# Patient Record
Sex: Male | Born: 1935 | Race: White | Hispanic: No | Smoking: Former smoker
Health system: Southern US, Community
[De-identification: ages and names within clinical notes are randomized; demographics above are authoritative.]

## PROBLEM LIST (undated history)

## (undated) DIAGNOSIS — I351 Nonrheumatic aortic (valve) insufficiency: Secondary | ICD-10-CM

## (undated) DIAGNOSIS — I714 Abdominal aortic aneurysm, without rupture, unspecified: Secondary | ICD-10-CM

## (undated) DIAGNOSIS — I82409 Acute embolism and thrombosis of unspecified deep veins of unspecified lower extremity: Secondary | ICD-10-CM

## (undated) DIAGNOSIS — M1990 Unspecified osteoarthritis, unspecified site: Secondary | ICD-10-CM

## (undated) DIAGNOSIS — I2510 Atherosclerotic heart disease of native coronary artery without angina pectoris: Secondary | ICD-10-CM

## (undated) DIAGNOSIS — I509 Heart failure, unspecified: Secondary | ICD-10-CM

## (undated) DIAGNOSIS — E785 Hyperlipidemia, unspecified: Secondary | ICD-10-CM

## (undated) DIAGNOSIS — Z8719 Personal history of other diseases of the digestive system: Secondary | ICD-10-CM

## (undated) DIAGNOSIS — I493 Ventricular premature depolarization: Secondary | ICD-10-CM

## (undated) DIAGNOSIS — I4891 Unspecified atrial fibrillation: Secondary | ICD-10-CM

## (undated) DIAGNOSIS — K254 Chronic or unspecified gastric ulcer with hemorrhage: Secondary | ICD-10-CM

## (undated) DIAGNOSIS — K219 Gastro-esophageal reflux disease without esophagitis: Secondary | ICD-10-CM

## (undated) DIAGNOSIS — K449 Diaphragmatic hernia without obstruction or gangrene: Secondary | ICD-10-CM

## (undated) DIAGNOSIS — I10 Essential (primary) hypertension: Secondary | ICD-10-CM

## (undated) DIAGNOSIS — K222 Esophageal obstruction: Secondary | ICD-10-CM

## (undated) HISTORY — DX: Heart failure, unspecified: I50.9

## (undated) HISTORY — DX: Abdominal aortic aneurysm, without rupture: I71.4

## (undated) HISTORY — DX: Nonrheumatic aortic (valve) insufficiency: I35.1

## (undated) HISTORY — DX: Unspecified atrial fibrillation: I48.91

## (undated) HISTORY — DX: Diaphragmatic hernia without obstruction or gangrene: K44.9

## (undated) HISTORY — PX: NASAL SINUS SURGERY: SHX719

## (undated) HISTORY — DX: Ventricular premature depolarization: I49.3

## (undated) HISTORY — DX: Unspecified osteoarthritis, unspecified site: M19.90

## (undated) HISTORY — DX: Personal history of other diseases of the digestive system: Z87.19

## (undated) HISTORY — DX: Esophageal obstruction: K22.2

## (undated) HISTORY — DX: Abdominal aortic aneurysm, without rupture, unspecified: I71.40

## (undated) HISTORY — DX: Chronic or unspecified gastric ulcer with hemorrhage: K25.4

## (undated) HISTORY — DX: Acute embolism and thrombosis of unspecified deep veins of unspecified lower extremity: I82.409

## (undated) HISTORY — PX: CHOLECYSTECTOMY: SHX55

## (undated) HISTORY — PX: APPENDECTOMY: SHX54

---

## 2003-05-02 HISTORY — PX: CORONARY ANGIOPLASTY: SHX604

## 2003-05-18 ENCOUNTER — Inpatient Hospital Stay (HOSPITAL_COMMUNITY): Admission: AD | Admit: 2003-05-18 | Discharge: 2003-05-24 | Payer: Self-pay | Admitting: Internal Medicine

## 2003-05-22 ENCOUNTER — Encounter: Payer: Self-pay | Admitting: *Deleted

## 2006-02-10 ENCOUNTER — Ambulatory Visit (HOSPITAL_COMMUNITY): Admission: RE | Admit: 2006-02-10 | Discharge: 2006-02-10 | Payer: Self-pay | Admitting: *Deleted

## 2006-02-10 HISTORY — PX: CARDIAC CATHETERIZATION: SHX172

## 2012-09-24 ENCOUNTER — Encounter (HOSPITAL_COMMUNITY)
Admission: EM | Disposition: A | Discharge status: Home / Self Care | Payer: Self-pay | Source: Other Acute Inpatient Hospital | Attending: Cardiovascular Disease

## 2012-09-24 ENCOUNTER — Inpatient Hospital Stay (HOSPITAL_COMMUNITY): Payer: Medicare Other

## 2012-09-24 ENCOUNTER — Observation Stay (HOSPITAL_COMMUNITY): Payer: Medicare Other

## 2012-09-24 ENCOUNTER — Ambulatory Visit (HOSPITAL_COMMUNITY): Admit: 2012-09-24 | Payer: Self-pay | Admitting: Cardiovascular Disease

## 2012-09-24 ENCOUNTER — Inpatient Hospital Stay (HOSPITAL_COMMUNITY)
Admission: EM | Admit: 2012-09-24 | Discharge: 2012-09-27 | DRG: 392 | Disposition: A | Discharge status: Home / Self Care | Payer: Medicare Other | Source: Other Acute Inpatient Hospital | Attending: Cardiovascular Disease | Admitting: Cardiovascular Disease

## 2012-09-24 ENCOUNTER — Encounter (HOSPITAL_COMMUNITY): Payer: Self-pay | Admitting: Internal Medicine

## 2012-09-24 DIAGNOSIS — T18108A Unspecified foreign body in esophagus causing other injury, initial encounter: Secondary | ICD-10-CM | POA: Diagnosis present

## 2012-09-24 DIAGNOSIS — K219 Gastro-esophageal reflux disease without esophagitis: Secondary | ICD-10-CM | POA: Diagnosis present

## 2012-09-24 DIAGNOSIS — K2980 Duodenitis without bleeding: Secondary | ICD-10-CM | POA: Diagnosis present

## 2012-09-24 DIAGNOSIS — K5900 Constipation, unspecified: Secondary | ICD-10-CM | POA: Diagnosis not present

## 2012-09-24 DIAGNOSIS — I10 Essential (primary) hypertension: Secondary | ICD-10-CM

## 2012-09-24 DIAGNOSIS — IMO0002 Reserved for concepts with insufficient information to code with codable children: Secondary | ICD-10-CM | POA: Diagnosis present

## 2012-09-24 DIAGNOSIS — K229 Disease of esophagus, unspecified: Secondary | ICD-10-CM | POA: Diagnosis present

## 2012-09-24 DIAGNOSIS — D696 Thrombocytopenia, unspecified: Secondary | ICD-10-CM | POA: Diagnosis present

## 2012-09-24 DIAGNOSIS — Z79899 Other long term (current) drug therapy: Secondary | ICD-10-CM

## 2012-09-24 DIAGNOSIS — R1310 Dysphagia, unspecified: Secondary | ICD-10-CM

## 2012-09-24 DIAGNOSIS — R079 Chest pain, unspecified: Secondary | ICD-10-CM | POA: Diagnosis present

## 2012-09-24 DIAGNOSIS — Z7982 Long term (current) use of aspirin: Secondary | ICD-10-CM

## 2012-09-24 DIAGNOSIS — E785 Hyperlipidemia, unspecified: Secondary | ICD-10-CM

## 2012-09-24 DIAGNOSIS — I2510 Atherosclerotic heart disease of native coronary artery without angina pectoris: Secondary | ICD-10-CM | POA: Diagnosis present

## 2012-09-24 DIAGNOSIS — K208 Other esophagitis without bleeding: Principal | ICD-10-CM | POA: Diagnosis present

## 2012-09-24 DIAGNOSIS — K228 Other specified diseases of esophagus: Secondary | ICD-10-CM

## 2012-09-24 DIAGNOSIS — Z9861 Coronary angioplasty status: Secondary | ICD-10-CM

## 2012-09-24 HISTORY — PX: LEFT HEART CATH: SHX5478

## 2012-09-24 HISTORY — DX: Essential (primary) hypertension: I10

## 2012-09-24 HISTORY — DX: Atherosclerotic heart disease of native coronary artery without angina pectoris: I25.10

## 2012-09-24 HISTORY — PX: CARDIAC CATHETERIZATION: SHX172

## 2012-09-24 HISTORY — DX: Hyperlipidemia, unspecified: E78.5

## 2012-09-24 HISTORY — DX: Gastro-esophageal reflux disease without esophagitis: K21.9

## 2012-09-24 LAB — BASIC METABOLIC PANEL
BUN: 19 mg/dL (ref 6–23)
CO2: 25 mEq/L (ref 19–32)
Chloride: 105 mEq/L (ref 96–112)
Creatinine, Ser: 1.05 mg/dL (ref 0.50–1.35)
Glucose, Bld: 163 mg/dL — ABNORMAL HIGH (ref 70–99)

## 2012-09-24 LAB — CBC
HCT: 39.3 % (ref 39.0–52.0)
Hemoglobin: 13.7 g/dL (ref 13.0–17.0)
MCH: 32.7 pg (ref 26.0–34.0)
MCV: 93.8 fL (ref 78.0–100.0)
Platelets: 131 10*3/uL — ABNORMAL LOW (ref 150–400)
RBC: 4.19 MIL/uL — ABNORMAL LOW (ref 4.22–5.81)
WBC: 10.5 10*3/uL (ref 4.0–10.5)

## 2012-09-24 LAB — TROPONIN I: Troponin I: <0.3 ng/mL (ref <0.30)

## 2012-09-24 LAB — CK TOTAL AND CKMB (NOT AT ARMC): Total CK: 130 U/L (ref 7–232)

## 2012-09-24 LAB — PROTIME-INR
INR: 1.15 (ref 0.00–1.49)
Prothrombin Time: 14.5 seconds (ref 11.6–15.2)

## 2012-09-24 LAB — MRSA PCR SCREENING: MRSA by PCR: NEGATIVE

## 2012-09-24 SURGERY — LEFT HEART CATH
Anesthesia: LOCAL

## 2012-09-24 MED ORDER — LIDOCAINE HCL (PF) 1 % IJ SOLN
INTRAMUSCULAR | Status: AC
  Filled 2012-09-24: qty 30

## 2012-09-24 MED ORDER — SODIUM CHLORIDE 0.9 % IV SOLN: 250.0000 mL | INTRAVENOUS | Status: DC

## 2012-09-24 MED ORDER — ATORVASTATIN CALCIUM 20 MG PO TABS
20.0000 mg | ORAL_TABLET | Freq: Every day | ORAL | Status: DC
  Administered 2012-09-24 – 2012-09-25 (×2): 20 mg via ORAL
  Filled 2012-09-24 (×4): qty 1

## 2012-09-24 MED ORDER — HEPARIN (PORCINE) IN NACL 2-0.9 UNIT/ML-% IJ SOLN
INTRAMUSCULAR | Status: AC
  Filled 2012-09-24: qty 1500

## 2012-09-24 MED ORDER — IOHEXOL 350 MG/ML SOLN
80.0000 mL | Freq: Once | INTRAVENOUS | Status: AC | PRN
  Administered 2012-09-24: 80 mL via INTRAVENOUS

## 2012-09-24 MED ORDER — NITROGLYCERIN 0.2 MG/ML ON CALL CATH LAB
INTRAVENOUS | Status: AC
  Filled 2012-09-24: qty 1

## 2012-09-24 MED ORDER — METOPROLOL TARTRATE 12.5 MG HALF TABLET
12.5000 mg | ORAL_TABLET | Freq: Two times a day (BID) | ORAL | Status: DC
  Administered 2012-09-24 – 2012-09-26 (×4): 12.5 mg via ORAL
  Filled 2012-09-24 (×6): qty 1

## 2012-09-24 MED ORDER — PANTOPRAZOLE SODIUM 40 MG PO TBEC: 40.0000 mg | DELAYED_RELEASE_TABLET | Freq: Every day | ORAL | Status: DC

## 2012-09-24 MED ORDER — SODIUM CHLORIDE 0.9 % IJ SOLN: 3.0000 mL | Freq: Two times a day (BID) | INTRAMUSCULAR | Status: DC

## 2012-09-24 MED ORDER — MORPHINE SULFATE 2 MG/ML IJ SOLN: 1.0000 mg | INTRAMUSCULAR | Status: DC | PRN

## 2012-09-24 MED ORDER — NITROGLYCERIN IN D5W 200-5 MCG/ML-% IV SOLN: 2.0000 ug/min | INTRAVENOUS | Status: DC

## 2012-09-24 MED ORDER — SODIUM CHLORIDE 0.9 % IV SOLN: INTRAVENOUS | Status: AC

## 2012-09-24 MED ORDER — HEPARIN (PORCINE) IN NACL 100-0.45 UNIT/ML-% IJ SOLN
750.0000 [IU]/h | INTRAMUSCULAR | Status: DC
  Filled 2012-09-24: qty 250

## 2012-09-24 MED ORDER — SODIUM CHLORIDE 0.9 % IV SOLN
INTRAVENOUS | Status: DC
  Administered 2012-09-24 – 2012-09-25 (×2): via INTRAVENOUS

## 2012-09-24 MED ORDER — ONDANSETRON HCL 4 MG/2ML IJ SOLN: 4.0000 mg | Freq: Four times a day (QID) | INTRAMUSCULAR | Status: DC | PRN

## 2012-09-24 MED ORDER — ALUM & MAG HYDROXIDE-SIMETH 200-200-20 MG/5ML PO SUSP
30.0000 mL | Freq: Four times a day (QID) | ORAL | Status: DC | PRN
  Administered 2012-09-24: 30 mL via ORAL
  Filled 2012-09-24: qty 30

## 2012-09-24 MED ORDER — SODIUM CHLORIDE 0.9 % IJ SOLN: 3.0000 mL | INTRAMUSCULAR | Status: DC | PRN

## 2012-09-24 MED ORDER — MORPHINE SULFATE 2 MG/ML IJ SOLN
1.0000 mg | INTRAMUSCULAR | Status: DC | PRN
  Administered 2012-09-25: 1 mg via INTRAVENOUS
  Filled 2012-09-24: qty 1

## 2012-09-24 MED ORDER — HEPARIN BOLUS VIA INFUSION
3000.0000 [IU] | Freq: Once | INTRAVENOUS | Status: DC
  Filled 2012-09-24: qty 3000

## 2012-09-24 MED ORDER — ONDANSETRON HCL 4 MG PO TABS: 4.0000 mg | ORAL_TABLET | Freq: Four times a day (QID) | ORAL | Status: DC | PRN

## 2012-09-24 MED ORDER — DIAZEPAM 2 MG PO TABS: 2.0000 mg | ORAL_TABLET | ORAL | Status: DC

## 2012-09-24 MED ORDER — ACETAMINOPHEN 325 MG PO TABS: 650.0000 mg | ORAL_TABLET | ORAL | Status: DC | PRN

## 2012-09-24 MED ORDER — ASPIRIN EC 325 MG PO TBEC
325.0000 mg | DELAYED_RELEASE_TABLET | Freq: Every day | ORAL | Status: DC
  Administered 2012-09-25 – 2012-09-26 (×2): 325 mg via ORAL
  Filled 2012-09-24 (×2): qty 1

## 2012-09-24 MED ORDER — NITROGLYCERIN IN D5W 200-5 MCG/ML-% IV SOLN
INTRAVENOUS | Status: AC
  Administered 2012-09-24: 50000 ug
  Filled 2012-09-24: qty 250

## 2012-09-24 MED ORDER — ACETAMINOPHEN 650 MG RE SUPP: 650.0000 mg | Freq: Four times a day (QID) | RECTAL | Status: DC | PRN

## 2012-09-24 MED ORDER — ENOXAPARIN SODIUM 40 MG/0.4ML ~~LOC~~ SOLN
40.0000 mg | SUBCUTANEOUS | Status: DC
  Filled 2012-09-24 (×2): qty 0.4

## 2012-09-24 MED ORDER — ACETAMINOPHEN 325 MG PO TABS: 650.0000 mg | ORAL_TABLET | Freq: Four times a day (QID) | ORAL | Status: DC | PRN

## 2012-09-24 NOTE — Progress Notes (Addendum)
ANTICOAGULATION CONSULT NOTE - Initial Consult  Pharmacy Consult for Heparin Indication: chest pain/ACS  Allergies  Allergen Reactions  . Penicillins     Patient Measurements: Height: 5\' 8"  (172.7 cm) Weight: 136 lb 14.5 oz (62.1 kg) IBW/kg (Calculated) : 68.4  Heparin Dosing Weight: 62 kg  Vital Signs: Temp: 98.6 F (37 C) (11/24 0721) Temp src: Oral (11/24 0721) BP: 104/67 mmHg (11/24 0721)  Labs:  Basename 09/24/12 0836  HGB 13.7  HCT 39.3  PLT 131*  APTT --  LABPROT 14.5  INR 1.15  HEPARINUNFRC --  CREATININE 1.05  CKTOTAL --  CKMB --  TROPONINI <0.30    Estimated Creatinine Clearance: 43.5 ml/min (by C-G formula based on Cr of 1.05).   Medical History: Past Medical History  Diagnosis Date  . CAD (coronary artery disease) 09/24/2012  . Dyslipidemia 09/24/2012  . GERD (gastroesophageal reflux disease) 09/24/2012  . HTN (hypertension) 09/24/2012    Assessment: 78 year old Caucasian male with hx of CAD s/p numerous PCI in the past, hypertension, dyslipidemia, GERD who was transferred from [facility] for evaluation of chest pain, will start IV heparin, possible cath tomorrow morning. No anticoagulation prior to admission. Base line INR 1.1, hgb 13.7, plt slightly low at 131  Goal of Therapy:  Heparin level 0.3-0.7 units/ml Monitor platelets by anticoagulation protocol: Yes   Plan:  Heparin bolus 3000 units Heparin infusion 750 units/hr 8 hr heparin level at 1830  Daily cbc and heparin level while on heparin F/u plan for cath.  Bayard Hugger, PharmD, BCPS  Clinical Pharmacist  Pager: [phone]  09/24/2012,10:07 AM  Addendum:  Pt. Is taking to cath, heparin is canceled. Will f/u plans after cath.

## 2012-09-24 NOTE — H&P (Signed)
PATIENT DETAILS Name: Devon Solis Age: 78 y.o. Sex: male Date of Birth: 1923/09/04 Admit Date: 09/24/2012 [id number] Not In System   CHIEF COMPLAINT:  Chest pain  HPI: Patient is a 78 year old Caucasian male with a past medical history of coronary artery disease status post numerous PCI in the past, hypertension, dyslipidemia, GERD who was transferred from [facility] for evaluation of chest pain. Per patient he was in his usual state of health, he started having sharp retrosternal chest pain since yesterday. Patient describes the pain as sharp, but not lethargic, last night it used to last for a minute and then slowly go away and then quickly recur. However over the past few hours it has started to reoccur less frequently. There is no radiation of the pain. There is associated nausea but no associated vomiting. There is no associated shortness of breath or diaphoresis. Because of persistent and recurrent nature of the pain, patient was transferred to [facility] for further evaluation and treatment. During my evaluation patient continued to have intermittent sharp retrosternal chest pains.   ALLERGIES:  Allergies not on file  PAST MEDICAL HISTORY: Past Medical History  Diagnosis Date  . CAD (coronary artery disease) 09/24/2012  . Dyslipidemia 09/24/2012  . GERD (gastroesophageal reflux disease) 09/24/2012  . HTN (hypertension) 09/24/2012    PAST SURGICAL HISTORY: No past surgical history on file.  MEDICATIONS AT HOME: Prior to Admission medications   Not on File    FAMILY HISTORY: No family history on file.  SOCIAL HISTORY:  does not have a smoking history on file. He does not have any smokeless tobacco history on file. His alcohol and drug histories not on file.  REVIEW OF SYSTEMS:  Constitutional:   No  weight loss, night sweats,  Fevers, chills, fatigue.  HEENT:    No headaches, Difficulty swallowing,Tooth/dental problems,Sore throat,  No  sneezing, itching, ear ache, nasal congestion, post nasal drip,   Cardio-vascular: Orthopnea, PND, swelling in lower extremities, anasarca, dizziness, palpitations  GI:  No heartburn, indigestion, abdominal pain, nausea, vomiting, diarrhea, change in  bowel habits, loss of appetite  Resp: No shortness of breath with exertion or at rest.  No excess mucus, no productive cough, No non-productive cough,  No coughing up of blood.No change in color of mucus.No wheezing.No chest wall deformity  Skin:  no rash or lesions.  GU:  no dysuria, change in color of urine, no urgency or frequency.  No flank pain.  Musculoskeletal: No joint pain or swelling.  No decreased range of motion.  No back pain.  Psych: No change in mood or affect. No depression or anxiety.  No memory loss.   PHYSICAL EXAM: Blood pressure 104/67, temperature 98.6 F (37 C), temperature source Oral, height 5\' 8"  (1.727 m), weight 62.1 kg (136 lb 14.5 oz).  General appearance :Awake, alert, not in any distress. Speech Clear. Not toxic Looking HEENT: Atraumatic and Normocephalic, pupils equally reactive to light and accomodation Neck: supple, no JVD. No cervical lymphadenopathy.  Chest:Good air entry bilaterally, no added sounds . CVS: S1 S2 regular, no murmurs. Pain is not reproducible on palpation  Abdomen: Bowel sounds present, Non tender and not distended with no gaurding, rigidity or rebound. Extremities: B/L Lower Ext shows no edema, both legs are warm to touch, with  dorsalis pedis pulses palpable. Neurology: Awake alert, and oriented X 3, CN II-XII intact, Non focal Skin:No Rash Wounds:N/A  LABS ON ADMISSION:  No results found for this basename: NA:2,K:2,CL:2,CO2:2,GLUCOSE:2,BUN:2,CREATININE:2,CALCIUM:2,MG:2,PHOS:2 in the last 72  hours No results found for this basename: AST:2,ALT:2,ALKPHOS:2,BILITOT:2,PROT:2,ALBUMIN:2 in the last 72 hours No results found for this basename: LIPASE:2,AMYLASE:2 in the last 72  hours No results found for this basename: WBC:2,NEUTROABS:2,HGB:2,HCT:2,MCV:2,PLT:2 in the last 72 hours No results found for this basename: CKTOTAL:3,CKMB:3,CKMBINDEX:3,TROPONINI:3 in the last 72 hours No results found for this basename: DDIMER:2 in the last 72 hours No components found with this basename: POCBNP:3   RADIOLOGIC STUDIES ON ADMISSION: No results found.  ASSESSMENT AND PLAN: Present on Admission:  . Unstable angina -Patient's chest pain is highly concerning for unstable angina -EKG and initial troponin done at [facility] are not  [place] -For now we will place him on aspirin, statin and beta blockers. Because of the ongoing and intermittent chest pain, we will place him on a nitroglycerin infusion-blood pressure permitting.  -I've already spoken with [facility] heart and vascular cardiology on call, they would evaluate shortly. -We will keep him n.p.o., patient may require left heart catheterization today  -I will hold off on starting him on anticoagulation as his cardiac enzymes are not elevated.   Marland Kitchen HTN (hypertension) -I will hold his Avapro for now. Continue his beta blocker and place him on a low-dose nitroglycerin infusion to see if this would alleviate his recurring chest pain.   Marland Kitchen CAD (coronary artery disease) -We'll continue aspirin, statins and beta blockers. -Cardiac enzymes will be cycled -Awaiting cardiology evaluation   . Dyslipidemia - continue with statins  . GERD (gastroesophageal reflux disease) -Place on PPI  Further plan will depend as patient's clinical course evolves and further radiologic and laboratory data become available. Patient will be monitored closely.   DVT Prophylaxis: -Prophylactic Lovenox  Code Status: -Full code  Total time spent for admission equals 45 minutes.  [facility] Pager [phone]  If 7PM-7AM, please contact night-coverage [email or web address] Password  [email or web address] 09/24/2012, 8:06 AM

## 2012-09-24 NOTE — Progress Notes (Signed)
Utilization Review Completed.Makenly Larabee T11/24/2013   

## 2012-09-24 NOTE — Consult Note (Signed)
Reason for Consult: chest pain  Referring Physician: Dr. Jerral Solis  Cardiologist: Dr. Rayburn Solis is an 78 y.o. male.    Chief Complaint: Transferred from [facility] after presenting with chest pain   HPI: 78 year old Caucasian male with a past medical history of coronary artery disease status post numerous PCI in the past (see below for cardiac history), hypertension, dyslipidemia, GERD who was transferred from [facility] for evaluation of chest pain. Per patient he was in his usual state of health, he started having pressure retrosternal chest pain since last pm. He was sitting in his lounge chair when the pain came on.   Patient describes the pain as pressure, last night it used to last for a minute and then slowly Solis away and then quickly recur. However over the past few hours it has started to reoccur less frequently. There is no radiation of the pain. There is associated nausea but no associated vomiting. There is no associated shortness of breath or diaphoresis. Because of persistent and recurrent nature of the pain, patient was transferred to [facility] for further evaluation and treatment.  He stated that this was much worse than with his heart attack.  Beginning NTG IV now.  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  Later that year he received 2 DES Cypher stents to the proximal AV circumflex and 1st oblique marginal artery branch.  Last nuc. Study was 2 years ago with a small scar in RCA territory, overall normal LV function.  EF was 57%.  Echo with EF 50-55%, mild mitral prolapse, mild mitral insufficiency and mild TR and mild to mod aortic insuff.  Previous history of Streptococcal bacteremia without clear evidence of endocarditis in 2011.  Reported hx of infrarenal AAA-very small no sign of progression.      Past Medical History  Diagnosis Date  . CAD (coronary artery disease) 09/24/2012  . Dyslipidemia  09/24/2012  . GERD (gastroesophageal reflux disease) 09/24/2012  . HTN (hypertension) 09/24/2012    Past Surgical History  Procedure Date  . Coronary angioplasty     History reviewed. No pertinent family history. Social History:  does not have a smoking history on file. He does not have any smokeless tobacco history on file. His alcohol and drug histories not on file. Lives independently   Allergies:  Allergies  Allergen Reactions  . Penicillins    Outpatient medications: Protonix 40 mg daily Imdur 60 mg daily Atorvastatin 20 mg at hs Metoprolol tartrate 12.5 mg BID Irbesartan 150 mg daily Niaspan 500 mg at hs Ntg sl prn ASA 81 mg daily Vit C 1000 mg daily Multivitamin  daily    ROS: General:no colds or fevers, no weight changes-he felt well until last pm  Skin:no rashes or ulcers HEENT:no blurred vision, no congestion CV:see HPI PUL:see HPI GI:no diarrhea constipation or melena, no indigestion GU:no hematuria, no dysuria MS:no joint pain, no claudication Neuro:no syncope, no lightheadedness, no history of stroke Endo:no diabetes, no thyroid disease   Blood pressure 104/67, temperature 98.6 F (37 C), temperature source Oral, height 5\' 8"  (1.727 m), weight 62.1 kg (136 lb 14.5 oz). PE: General: alert and oriented, pleasant WM, obviously not feeling well Skin:warm slightly moist HEENT:normocephalic, sclera clear Neck:supple, no JVD, no bruits Heart:S1S2 RRR, 2-3/6 systolic murmur, no gallup or rub Lungs:clear without rales, rhonchi or wheezes Abd:+ BS, soft, nontender Ext:2+ radial pulses bil, 2+ pedal pulses bil, no lower ext edema  Neuro:alert and oriented X 3 MAE, follows commands    Assessment/Plan Principal Problem:  *Unstable angina Active Problems:  HTN (hypertension)  CAD (coronary artery disease), 3 cypher stents to RCA after MI, 2 cypher stents to LCX 2004   Dyslipidemia  GERD (gastroesophageal reflux disease)  PLAN: add IV NTG, monitor BP   Serial cardiac enzymes,  Cardiac cath in AM if we can control pain, ? CT of chest with known mildly dilated ascending aorta.  Rec'd K+  For K+ level of 3.3, metoprolol 5mg  IV, ASA, Phenergan zofran and morphine 2 mg IV in [place].     Other labs, Cr 1.04, troponin 0.01 at [place].  bnp 43  H/H 15/43,  plts 138 INR 1.1   We will be glad to assume care.  Devon Solis,Devon Solis 09/24/2012, 8:47 AM  Agree with note written by Devon Solis RNP  Pt with known CAD s/p remote stening of RCA and LCX. Small TAA and AAA. Admitted with [place] on IV NTG. EKG w/o acute changes. Enz neg. Labs OK. Exam benign. I am concerned that his CP is waxing and waning. Plan cardiac cath this AM. Discussed with pt and family.  Devon Solis 09/24/2012 10:29 AM

## 2012-09-24 NOTE — Progress Notes (Signed)
Chaplain responded to a nurse request through a page message and visited patient at [phone]. Patient was alert, awake and responsive. Two family members were present during my visit. Patient was waiting to Cath lab for a procedure. Chaplain listened empathetically to patient and family members. Chaplain shared words of comfort and hope, and provided ministry of presence. Chaplain also prayed for both patient and family members. Chaplain will continue to provide spiritual care to patient and family as needed. Family members and patient expressed appreciation for Chaplain's visit and prayer.

## 2012-09-24 NOTE — Op Note (Signed)
Devon Solis is a 78 y.o. male    [id number] LOCATION:  FACILITY: [facility]  PHYSICIAN: Nanetta Batty, M.D. 1922-11-30   DATE OF PROCEDURE:  09/24/2012  DATE OF DISCHARGE:  [facility]  CARDIAC CATHETERIZATION     History obtained from chart review. 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 Royann Shivers with a prior history of CAD status post stenting of his RCA and LCx in the past. He developed substernal chest pain last night and presented to [facility] where he was transferred to tone for further evaluation. He ruled out for myocardial infarction. EKG showed no acute changes. Because of ongoing chest pain he presents today for cardiac catheterization to rule out an ischemic etiology.   PROCEDURE DESCRIPTION:    The patient was brought to the second floor  [place] Cardiac cath lab in the postabsorptive state. He was not premedicated . His right groinwas prepped and shaved in usual sterile fashion. Xylocaine 1% was used for local anesthesia. A 6 French sheath was inserted into the common femoral artery using standard Seldinger technique. 6 French left Judkins diagnostic catheters along with a 6 French pigtail catheter were used for selective coronary angiography, left ventriculography and supra valvular aortography. Visipaque she is the entirety of the case. Retrograde aortic, left ventricular and pressures were recorded.   HEMODYNAMICS:    AO SYSTOLIC/AO DIASTOLIC: 117/67   LV SYSTOLIC/LV DIASTOLIC: 115/10  ANGIOGRAPHIC RESULTS:   1. Left main; left main was short and normal  2. LAD; minor irregularities. There was a small to moderate-sized diagonal branch which was high in the LAD which had a 80% stenosis in the midportion of the bifurcation point. 3. Left circumflex; left circumflex that was widely patent.  4. Right coronary artery; the right coronary artery was dominant with a patent stent 5. Left ventriculography; RAO left  ventriculogram was performed using  25 mL of Visipaque dye at 12 mL/second. The overall LVEF estimated  60 %  Without wall motion abnormalities 6. Supravalvular aortogram was performed using 20 cc of Visipaque dye at 20cc per second. The ascending aorta was normal in caliber without evidence of dissection.  IMPRESSION:Mr. Alonso has insignificant coronary artery disease. He does have a high moderate-sized first diagonal branch which has an 80% stenosis at a bifurcation point in the mid vessel however this is a 1.7-2 mm vessel and most with excellent flow I do not think this is responsible for his symptoms. Otherwise he has normal left ventricular function without segmental wall motion abnormalities with widely patent stents and no evidence of aortic dissection. I do not think that there is anything on his angiogram response with her symptoms and this suggests that his symptoms are noncardiac. The sheath was removed and pressure was held and the groin to achieve hemostasis. Patient left the Cath Lab in stable condition.  Runell Gess MD, [facility] 09/24/2012 12:04 PM

## 2012-09-24 NOTE — Progress Notes (Signed)
Received pt at 07:20 from [place] . Nitro 1.5 ml started to alleviate resting, non radiating left chest pain. Pt NPO. Family at bedside awaiting transfer to cath lab.

## 2012-09-24 NOTE — H&P (Signed)
    Pt was reexamined and existing H & P reviewed. No changes found.  Runell Gess, MD [facility] 09/24/2012 11:16 AM

## 2012-09-25 ENCOUNTER — Encounter (HOSPITAL_COMMUNITY): Payer: Self-pay | Admitting: *Deleted

## 2012-09-25 DIAGNOSIS — R1310 Dysphagia, unspecified: Secondary | ICD-10-CM

## 2012-09-25 LAB — DIFFERENTIAL
Basophils Absolute: 0 10*3/uL (ref 0.0–0.1)
Basophils Relative: 0 % (ref 0–1)
Monocytes Absolute: 1 10*3/uL (ref 0.1–1.0)
Neutro Abs: 6.9 10*3/uL (ref 1.7–7.7)
Neutrophils Relative %: 69 % (ref 43–77)

## 2012-09-25 LAB — COMPREHENSIVE METABOLIC PANEL
AST: 18 U/L (ref 0–37)
Albumin: 3.4 g/dL — ABNORMAL LOW (ref 3.5–5.2)
Chloride: 107 mEq/L (ref 96–112)
Creatinine, Ser: 1 mg/dL (ref 0.50–1.35)
Total Bilirubin: 0.7 mg/dL (ref 0.3–1.2)

## 2012-09-25 LAB — CBC
HCT: 38.5 % — ABNORMAL LOW (ref 39.0–52.0)
Hemoglobin: 13.2 g/dL (ref 13.0–17.0)
MCV: 94.6 fL (ref 78.0–100.0)
RBC: 4.07 MIL/uL — ABNORMAL LOW (ref 4.22–5.81)
RDW: 12.9 % (ref 11.5–15.5)
WBC: 9.9 10*3/uL (ref 4.0–10.5)

## 2012-09-25 MED ORDER — ISOSORBIDE MONONITRATE ER 30 MG PO TB24
30.0000 mg | ORAL_TABLET | Freq: Every day | ORAL | Status: DC
  Administered 2012-09-25 – 2012-09-26 (×2): 30 mg via ORAL
  Filled 2012-09-25 (×2): qty 1

## 2012-09-25 MED ORDER — PANTOPRAZOLE SODIUM 40 MG PO TBEC
40.0000 mg | DELAYED_RELEASE_TABLET | Freq: Two times a day (BID) | ORAL | Status: DC
  Administered 2012-09-25 – 2012-09-26 (×2): 40 mg via ORAL
  Filled 2012-09-25 (×2): qty 1

## 2012-09-25 MED ORDER — GI COCKTAIL ~~LOC~~
30.0000 mL | Freq: Three times a day (TID) | ORAL | Status: DC | PRN
  Filled 2012-09-25: qty 30

## 2012-09-25 MED ORDER — GI COCKTAIL ~~LOC~~
30.0000 mL | ORAL | Status: AC
  Administered 2012-09-25: 30 mL via ORAL
  Filled 2012-09-25: qty 30

## 2012-09-25 MED ORDER — BISACODYL 10 MG RE SUPP
10.0000 mg | Freq: Every day | RECTAL | Status: DC | PRN
  Administered 2012-09-26: 10 mg via RECTAL
  Filled 2012-09-25: qty 1

## 2012-09-25 MED ORDER — SODIUM CHLORIDE 0.9 % IV SOLN: INTRAVENOUS | Status: DC

## 2012-09-25 NOTE — Progress Notes (Addendum)
Pt. Seen and examined. Agree with the NP/PA-C note as written.  Chest pain improved with GI cocktail. Cath films demonstrate high-grade disease of a small vessel, which could cause angina, however, this is too small to intervene upon. There is also a suggestion of a GERD component, including pain and burning when he swallows. No real dysphagia to solids. History of gastric ulcers in the past. I agree with GI consultation. Will add low dose imdur 30 mg daily.  Chrystie Nose, MD, [facility] Attending Cardiologist [facility]

## 2012-09-25 NOTE — Progress Notes (Signed)
Subjective: 1 episode of pain on arrival to TCU, relieved with Maalox  Now continued chest and epigastric pain Objective: Vital signs in last 24 hours: Temp:  [97.9 F (36.6 C)-98.7 F (37.1 C)] 98.1 F (36.7 C) (11/25 0800) Pulse Rate:  [65-84] 84  (11/25 0800) Resp:  [12-18] 14  (11/25 0600) BP: (96-143)/(61-88) 125/82 mmHg (11/25 0800) SpO2:  [90 %-97 %] 96 % (11/25 0800) Weight change:  Last BM Date: 09/23/12 Intake/Output from previous day: +65 11/24 0701 - 11/25 0700 In: 1025 [I.V.:1025] Out: 910 [Urine:910] Intake/Output this shift:    PE: General:alert and oriented Heart:S1S2 RRR + systolic murmur Lungs:clear without rales or rhonchi Abd:+ BS, soft, + tenderness mid upper abd pain Ext:no edema, Rt groin cath site without hematoma    Lab Results:  [facility] 09/25/12 0500 09/24/12 0836  WBC 9.9 10.5  HGB 13.2 13.7  HCT 38.5* 39.3  PLT 108* 131*   BMET  Basename 09/25/12 0500 09/24/12 0836  NA 142 139  K 3.9 4.4  CL 107 105  CO2 27 25  GLUCOSE 105* 163*  BUN 13 19  CREATININE 1.00 1.05  CALCIUM 8.8 9.4    Basename 09/24/12 1913 09/24/12 1311  TROPONINI <0.30 <0.30     Hepatic Function Panel  Basename 09/25/12 0500  PROT 5.6*  ALBUMIN 3.4*  AST 18  ALT 15  ALKPHOS 52  BILITOT 0.7  BILIDIR --  IBILI --    Studies/Results: Ct Angio Chest Pe W/cm &/or Wo Cm  09/24/2012  *RADIOLOGY REPORT*  Clinical Data: Chest pain and history of coronary artery disease. Cardiac catheterization earlier today showed no evidence of significant obstructive coronary disease.  CT ANGIOGRAPHY CHEST  Technique:  Multidetector CT imaging of the chest using the standard protocol during bolus administration of intravenous contrast. Multiplanar reconstructed images including MIPs were obtained and reviewed to evaluate the vascular anatomy.  Contrast: 80mL OMNIPAQUE IOHEXOL 350 MG/ML SOLN  Comparison: None.  Findings: The pulmonary arteries are well opacified.  There is no  evidence of pulmonary embolism.  The aorta is moderately ectatic but shows no gross aneurysmal disease.  The thoracic aorta is not adequately opacified to evaluate for aortic dissection.  The heart is mildly enlarged.  Coronary stents and calcified coronary plaque noted.  No pleural or pericardial fluid is identified.  Bibasilar atelectasis present in the lungs.  No focal infiltrates, nodules or enlarged lymph nodes are identified.  Bony structures are unremarkable.  IMPRESSION:  1.  No evidence of acute pulmonary embolism. 2.  Ectasia of the thoracic aorta without overt aneurysmal disease.   Original Report Authenticated By: Irish Lack, M.D.    Dg Chest Port 1 View  09/24/2012  *RADIOLOGY REPORT*  Clinical Data: shortness of breath, chest pain  PORTABLE CHEST - 1 VIEW  Comparison: 02/10/2006  Findings: Normal heart size and vascularity.  Clear lungs.  No focal pneumonia, collapse, consolidation, effusion, or pneumothorax.  Trachea is midline.  Atherosclerosis of the aorta.  IMPRESSION: No acute chest finding.   Original Report Authenticated By: Judie Petit. Miles Costain, M.D.     Medications: I have reviewed the patient's current medications.    Marland Kitchen aspirin EC  325 mg Oral Daily  . atorvastatin  20 mg Oral q1800  . [COMPLETED] heparin      . [COMPLETED] lidocaine      . metoprolol tartrate  12.5 mg Oral BID  . [COMPLETED] nitroGLYCERIN      . [COMPLETED] nitroGLYCERIN      . pantoprazole  40 mg Oral Q1200  . [DISCONTINUED] diazepam  2 mg Oral On Call  . [DISCONTINUED] enoxaparin (LOVENOX) injection  40 mg Subcutaneous Q24H  . [DISCONTINUED] heparin  3,000 Units Intravenous Once  . [DISCONTINUED] pantoprazole  40 mg Oral Q1200  . [DISCONTINUED] sodium chloride  3 mL Intravenous Q12H  . [DISCONTINUED] sodium chloride  3 mL Intravenous Q12H   Assessment/Plan: Principal Problem:  *Chest pain at rest, negative MI, Stable Cardiac cath, prob GI source Active Problems:  HTN (hypertension)  CAD (coronary  artery disease), 3 cypher stents to RCA after MI, 2 cypher stents to LCX 2004 stable with cath 09/24/12   Dyslipidemia  GERD (gastroesophageal reflux disease)  PLAN:increase protonix.  GI consult, pt has never seen anyone for GI.  Will give GI cocktail for pain.  No MI, stable cath, negative CT angio for PE.  LOS: 1 day   Devon Solis 09/25/2012, 8:45 AM

## 2012-09-25 NOTE — Consult Note (Signed)
Patient seen, examined, and I agree with the above documentation, including the assessment and plan. This pain is triggered by swallowing, and could be consistent with pill esophagitis Agree with EGD for further investigation Cardiac workup to this point negative for lesion that could be intervened upon. CTA chest negative for PE Agree with Protonix for now Further recommendations after EGD

## 2012-09-25 NOTE — Consult Note (Signed)
[facility] Consult: 12:47 PM 09/25/2012   Referring Provider: Dr Rennis Golden, [facility] Primary Care Physician:  Dr Burna Mortimer in [place] Primary Gastroenterologist:  none   Reason for Consultation:  Swallowing is causing chest pain.  HPI: Devon Solis is a 78 y.o. male. Hx of MI with drug eluting cardiac stents in 2004.  Coumadin replaced Plavix when he developed LE DVT.  He has since been taken off Coumadin. On Sat night as he ate a salad, he developed retrosternal cp and shaking.  Pain relieved with morphine but not with NTG or with Gi cocktail.  No nausea.   Cardiac enzymes negative. Cardiac cath per Dr [id number] patent stents, 80% disease at first diagonal, not felt to be causing cp.  Dr Rennis Golden says: "Cath films demonstrate high-grade disease of a small vessel, which could cause angina, however, this is too small to intervene upon" .  CT angio of chest is negative for PE. He was felling better this AM and tolerated slid food for breakfast, but at lunch of pudding and beef stew, had recurrent post prandial, non-radiating chest pain.  His LFTs are normal.  GB was removed in the 1970s.  Only takes 81 mg ASA.  Does not take meds for osteoporosis.   Has heartburn if he eats specific foods, salad greens being one of these.  No anorexia, no weight loss.  No sense of food getting stuck, no regurgitation. Takes a PPI daily.  Never had an EGD and has refused colonoscopy in past.  No bloody or black stools.  Was told he had a stomach ulcer 40 years ago when he was having abdominal pain, but he did not have EGD, may have had UGI series however.   He is not anemic, but platelets are non-critically depleted at 108K. No hx of liver or biliary problems.   Doesn't drink etoh, smoke or chew tobacco.   Past Medical History  Diagnosis Date  . CAD (coronary artery disease) 09/24/2012  . Dyslipidemia 09/24/2012  . GERD (gastroesophageal reflux disease) 09/24/2012  .  HTN (hypertension) 09/24/2012    Past Surgical History  Procedure Date  . Coronary angioplasty     Prior to Admission medications   Medication Sig Start Date End Date Taking? Authorizing Provider  Ascorbic Acid (VITAMIN C) 1000 MG tablet Take 1,000 mg by mouth daily.   Yes Historical Provider, MD  aspirin 81 MG tablet Take 81 mg by mouth daily.   Yes Historical Provider, MD  atorvastatin (LIPITOR) 20 MG tablet Take 20 mg by mouth daily.   Yes Historical Provider, MD  irbesartan (AVAPRO) 150 MG tablet Take 150 mg by mouth at bedtime.   Yes Historical Provider, MD  isosorbide mononitrate (IMDUR) 60 MG 24 hr tablet Take 30 mg by mouth daily. Patient uses 30 mg in the am, and 30 mg in the pm.   Yes Historical Provider, MD  metoprolol tartrate (LOPRESSOR) 25 MG tablet Take 25 mg by mouth 2 (two) times daily.   Yes Historical Provider, MD  Multiple Vitamin (MULTIVITAMIN) tablet Take 1 tablet by mouth daily.   Yes Historical Provider, MD  niacin (NIASPAN) 500 MG CR tablet Take 500 mg by mouth at bedtime.   Yes Historical Provider, MD  nitroGLYCERIN (NITROSTAT) 0.4 MG SL tablet Place 0.4 mg under the tongue every 5 (five) minutes as needed.   Yes Historical Provider, MD  pantoprazole (PROTONIX) 40 MG tablet Take 40 mg by mouth daily.   Yes Historical Provider, MD    Scheduled Meds:    .  aspirin EC  325 mg Oral Daily  . atorvastatin  20 mg Oral q1800  . [COMPLETED] gi cocktail  30 mL Oral STAT  . isosorbide mononitrate  30 mg Oral Daily  . metoprolol tartrate  12.5 mg Oral BID  . pantoprazole  40 mg Oral BID AC   Infusions:    . sodium chloride 50 mL/hr at 09/25/12 0700  . [EXPIRED] sodium chloride     PRN Meds: acetaminophen, acetaminophen, alum & mag hydroxide-simeth, gi cocktail, [COMPLETED] iohexol, morphine injection, morphine injection, ondansetron (ZOFRAN) IV, ondansetron   Allergies as of 09/24/2012 - Review Complete 09/24/2012  Allergen Reaction Noted  . Penicillins   09/24/2012    History reviewed. No pertinent family history. No colon cancer, ulcer dz, gi bleeds, liver disease.   History   Social History  . Marital Status: Widowed    Spouse Name: N/A    Number of Children: N/A  . Years of Education: N/A   Occupational History  . Not on file.   Social History Main Topics  . Smoking status: Never Smoker   . Smokeless tobacco: Not on file  . Alcohol Use: none  . Drug Use:   . Sexually Active:      REVIEW OF SYSTEMS: Constitutional:  Stable weight ENT:  No nose bleeds Pulm:  No SOB, no cough CV:  HPI GU:  No nocturia, no frequency GI:  HPI Heme:  No anemia, no previous treatment with Iron. .    Transfusions:  None ever Neuro:  No headaches, dizziness, falls, paresthesias.  Derm:  No rash , sores, itching Endocrine:  No excessive thirst or polyuria Immunization:  Not reviewed.  Travel:  None except [place] to [place].  PHYSICAL EXAM: Vital signs in last 24 hours: Temp:  [97.9 F (36.6 C)-98.9 F (37.2 C)] 98.9 F (37.2 C) (11/25 1224) Pulse Rate:  [65-84] 71  (11/25 1224) Resp:  [12-18] 14  (11/25 0600) BP: (96-143)/(61-88) 118/65 mmHg (11/25 1224) SpO2:  [90 %-97 %] 97 % (11/25 1224)  General: elderly, well appearing wm.  comfortable Head:  No asymmetry or swelling of face  Eyes:  No icterus or pallor Ears:  HOH  Nose:  No discharge Mouth:  Pink, clear oral MM.  Many missing teeth. Neck:  No JVD or bruits  No masses Lungs:  Clear B.  Slight decreased sounds on right.  No SOB or cough.  No chest wall tenderness.  Heart: RRR.  No MRG. Abdomen:  Soft, NT, ND.   Rectal: not done   Musc/Skeltl: some arthritic changes in fingers and knees.  No joint swelliing.  Extremities:  No pedal edema.  3+ pedal pulses  Neurologic:  Oriented x3. HOH, no tremor.  No confusion.  Moves all 4s easily Skin:  No rash, sores, spider agionas Tattoos:  None  Nodes:  No groin or cervical adenopathy.    Psych:  Pleasant, relaxed, cooperative.    Intake/Output from previous day: 11/24 0701 - 11/25 0700 In: 1025 [I.V.:1025] Out: 910 [Urine:910] Intake/Output this shift: Total I/O In: 200 [I.V.:200] Out: -   LAB RESULTS:  Basename 09/25/12 0500 09/24/12 0836  WBC 9.9 10.5  HGB 13.2 13.7  HCT 38.5* 39.3  PLT 108* 131*   BMET Lab Results  Component Value Date   NA 142 09/25/2012   NA 139 09/24/2012   K 3.9 09/25/2012   K 4.4 09/24/2012   CL 107 09/25/2012   CL 105 09/24/2012   CO2 27 09/25/2012   CO2  25 09/24/2012   GLUCOSE 105* 09/25/2012   GLUCOSE 163* 09/24/2012   BUN 13 09/25/2012   BUN 19 09/24/2012   CREATININE 1.00 09/25/2012   CREATININE 1.05 09/24/2012   CALCIUM 8.8 09/25/2012   CALCIUM 9.4 09/24/2012   LFT  Basename 09/25/12 0500  PROT 5.6*  ALBUMIN 3.4*  AST 18  ALT 15  ALKPHOS 52  BILITOT 0.7  BILIDIR --  IBILI --   PT/INR Lab Results  Component Value Date   INR 1.15 09/24/2012   RADIOLOGY STUDIES: Ct Angio Chest Pe W/cm &/or Wo Cm  09/24/2012  *RADIOLOGY REPORT*  Clinical Data: Chest pain and history of coronary artery disease. Cardiac catheterization earlier today showed no evidence of significant obstructive coronary disease.  CT ANGIOGRAPHY CHEST  Technique:  Multidetector CT imaging of the chest using the standard protocol during bolus administration of intravenous contrast. Multiplanar reconstructed images including MIPs were obtained and reviewed to evaluate the vascular anatomy.  Contrast: 80mL OMNIPAQUE IOHEXOL 350 MG/ML SOLN  Comparison: None.  Findings: The pulmonary arteries are well opacified.  There is no evidence of pulmonary embolism.  The aorta is moderately ectatic but shows no gross aneurysmal disease.  The thoracic aorta is not adequately opacified to evaluate for aortic dissection.  The heart is mildly enlarged.  Coronary stents and calcified coronary plaque noted.  No pleural or pericardial fluid is identified.  Bibasilar atelectasis present in the lungs.  No focal  infiltrates, nodules or enlarged lymph nodes are identified.  Bony structures are unremarkable.  IMPRESSION:  1.  No evidence of acute pulmonary embolism. 2.  Ectasia of the thoracic aorta without overt aneurysmal disease.   Original Report Authenticated By: Irish Lack, M.D.    Dg Chest Port 1 View 09/24/2012  *RADIOLOGY REPORT*  Clinical Data: shortness of breath, chest pain  PORTABLE CHEST - 1 VIEW  Comparison: 02/10/2006  Findings: Normal heart size and vascularity.  Clear lungs.  No focal pneumonia, collapse, consolidation, effusion, or pneumothorax.  Trachea is midline.  Atherosclerosis of the aorta.  IMPRESSION: No acute chest finding.   Original Report Authenticated By: Judie Petit. Shick, M.D.     ENDOSCOPIC STUDIES: None ever.  Refused colonoscopy in past  IMPRESSION: *  Chest pain in pt with hx CAD, previous stents. Pattern is not cardiac in character.  Cardiac cath 11/24:  "demonstrates high-grade disease of a small vessel, which could cause angina, however, this is too small to intervene upon" per Dr Rennis Golden.  CT chest neg for PE Pain is triggered by swallowing.  Rule out stricture, rule out ulcer, rule out infectious esophagitis. *  Hx "gastric ulcers" forty or more years ago, no formal testing done.    PLAN: *  EGD tomorrow.  See orders.  Continue the Protonix.  Change to softer diet.    LOS: 1 day   Jennye Moccasin  09/25/2012, 12:47 PM Pager: [phone]

## 2012-09-26 ENCOUNTER — Encounter (HOSPITAL_COMMUNITY): Payer: Self-pay

## 2012-09-26 ENCOUNTER — Encounter (HOSPITAL_COMMUNITY)
Admission: EM | Disposition: A | Discharge status: Home / Self Care | Payer: Self-pay | Source: Other Acute Inpatient Hospital | Attending: Cardiovascular Disease

## 2012-09-26 ENCOUNTER — Observation Stay (HOSPITAL_COMMUNITY): Payer: Medicare Other

## 2012-09-26 DIAGNOSIS — K229 Disease of esophagus, unspecified: Secondary | ICD-10-CM

## 2012-09-26 DIAGNOSIS — K228 Other specified diseases of esophagus: Secondary | ICD-10-CM

## 2012-09-26 HISTORY — PX: ESOPHAGOGASTRODUODENOSCOPY (EGD) WITH ESOPHAGEAL DILATION: SHX5812

## 2012-09-26 LAB — DIFFERENTIAL
Lymphocytes Relative: 16 % (ref 12–46)
Lymphs Abs: 1.4 10*3/uL (ref 0.7–4.0)
Monocytes Absolute: 1 10*3/uL (ref 0.1–1.0)
Monocytes Relative: 11 % (ref 3–12)
Neutro Abs: 6 10*3/uL (ref 1.7–7.7)

## 2012-09-26 LAB — BASIC METABOLIC PANEL
BUN: 13 mg/dL (ref 6–23)
CO2: 25 mEq/L (ref 19–32)
Chloride: 104 mEq/L (ref 96–112)
Creatinine, Ser: 0.91 mg/dL (ref 0.50–1.35)
Glucose, Bld: 95 mg/dL (ref 70–99)

## 2012-09-26 SURGERY — ESOPHAGOGASTRODUODENOSCOPY (EGD) WITH ESOPHAGEAL DILATION
Anesthesia: Moderate Sedation

## 2012-09-26 MED ORDER — BUTAMBEN-TETRACAINE-BENZOCAINE 2-2-14 % EX AERO
INHALATION_SPRAY | CUTANEOUS | Status: DC | PRN
  Administered 2012-09-26: 2 via TOPICAL

## 2012-09-26 MED ORDER — ASPIRIN 300 MG RE SUPP
300.0000 mg | Freq: Every day | RECTAL | Status: DC
  Administered 2012-09-27: 300 mg via RECTAL
  Filled 2012-09-26 (×2): qty 1

## 2012-09-26 MED ORDER — MIDAZOLAM HCL 10 MG/2ML IJ SOLN
INTRAMUSCULAR | Status: DC | PRN
  Administered 2012-09-26: 2 mg via INTRAVENOUS
  Administered 2012-09-26: 1 mg via INTRAVENOUS
  Administered 2012-09-26: 2 mg via INTRAVENOUS

## 2012-09-26 MED ORDER — FENTANYL CITRATE 0.05 MG/ML IJ SOLN
INTRAMUSCULAR | Status: AC
  Filled 2012-09-26: qty 4

## 2012-09-26 MED ORDER — METOPROLOL TARTRATE 25 MG/10 ML ORAL SUSPENSION
12.5000 mg | Freq: Two times a day (BID) | ORAL | Status: DC
  Administered 2012-09-27: 12.5 mg via ORAL
  Filled 2012-09-26 (×3): qty 5

## 2012-09-26 MED ORDER — FENTANYL CITRATE 0.05 MG/ML IJ SOLN
INTRAMUSCULAR | Status: DC | PRN
  Administered 2012-09-26 (×3): 25 ug via INTRAVENOUS

## 2012-09-26 MED ORDER — DIPHENHYDRAMINE HCL 50 MG/ML IJ SOLN
INTRAMUSCULAR | Status: AC
  Filled 2012-09-26: qty 1

## 2012-09-26 MED ORDER — IOHEXOL 300 MG/ML  SOLN
80.0000 mL | Freq: Once | INTRAMUSCULAR | Status: AC | PRN
  Administered 2012-09-26: 80 mL via INTRAVENOUS

## 2012-09-26 MED ORDER — PANTOPRAZOLE SODIUM 40 MG IV SOLR
40.0000 mg | Freq: Two times a day (BID) | INTRAVENOUS | Status: DC
  Administered 2012-09-26 – 2012-09-27 (×2): 40 mg via INTRAVENOUS
  Filled 2012-09-26 (×3): qty 40

## 2012-09-26 MED ORDER — MIDAZOLAM HCL 5 MG/ML IJ SOLN
INTRAMUSCULAR | Status: AC
  Filled 2012-09-26: qty 3

## 2012-09-26 NOTE — Progress Notes (Signed)
Have adjusted meds.  Discussed with Dr. Royann Shivers and Dr. Allyson Sabal  Will d/c lipitor and imdur for now.

## 2012-09-26 NOTE — Op Note (Signed)
Devon Solis [facility] [address] [place], [address]   ENDOSCOPY PROCEDURE REPORT  PATIENT: Devon Solis, Devon Solis  MR#: [id number] BIRTHDATE: 1923/03/01 , 89  yrs. old GENDER: Male ENDOSCOPIST: Beverley Fiedler, MD REFERRED BY:  Runell Gess PROCEDURE DATE:  09/26/2012 PROCEDURE:  EGD, diagnostic ASA CLASS:     Class III INDICATIONS:  Chest pain.   Odynophagia. MEDICATIONS: These medications were titrated to patient response per physician's verbal order, Versed 5 mg IV, and Fentanyl 75 mcg IV TOPICAL ANESTHETIC: Cetacaine Spray  DESCRIPTION OF PROCEDURE: After the risks benefits and alternatives of the procedure were thoroughly explained, informed consent was obtained.  The Pentax Gastroscope [id number] endoscope was introduced through the mouth and advanced to the second portion of the duodenum. Without limitations.  The instrument was slowly withdrawn as the mucosa was fully examined.     ESOPHAGUS: Partially obstructing and partial circumferential mass lesion was found extending from 25 cm from the incisors to 37 cm. Difficult to fully assess, as a large portion appears to be blood clot.  The proximal edge is smooth in appearance, query ulcerative esophagitis with large adherent blood clot.  Biopsies were not obtained due to concern for bleeding.  A normal Z-line was observed 39 cm from the incisors.   2 pills found in the mid esophagus and advanced into stomach.  STOMACH: The mucosa of the stomach appeared normal.   Small hiatus hernia.  DUODENUM: Mild duodenal inflammation was found in the duodenal bulb. The duodenal mucosa showed no abnormalities in the 2nd part of the duodenum.  Retroflexed views revealed a hiatal hernia.     The scope was then withdrawn from the patient and the procedure completed.  COMPLICATIONS: There were no complications.  ENDOSCOPIC IMPRESSION: 1.   Mass lesion extending 25 cm - 37 cm from the incisors with large blood clot (see  above) 2.   Normal Z-line was observed 39 cm from the incisors 3.   2 pills found in mid esophagus and advanced into stomach 4.   The mucosa of the stomach appeared normal 5.   Mild duodenal inflammation was found in the duodenal bulb 6.   The duodenal mucosa showed no abnormalities in the 2nd part of the duodenum  RECOMMENDATIONS: 1.  CT Scan chest for further characterization of esophageal lesion 2.  Liquid diet with pureed foods for now.  Convert medications to liquid where possible. 3.  Avoid NSAIDs, anticoagulation if possible 4.  Repeat EGD may be needed for possible biopsy vs reassessment for healing   eSigned:  Beverley Fiedler, MD 09/26/2012 1:00 PM revised  PATIENT NAME:  Devon Solis, Devon Solis MR#: [id number]

## 2012-09-26 NOTE — Progress Notes (Signed)
NP made aware about the recommendation of GI MD to change po to liquid.

## 2012-09-26 NOTE — Progress Notes (Signed)
Subjective:  No further CP  Objective:  Temp:  [97.8 F (36.6 C)-98.9 F (37.2 C)] 98.4 F (36.9 C) (11/26 0352) Pulse Rate:  [64-76] 64  (11/26 0400) BP: (98-138)/(65-85) 126/71 mmHg (11/26 0747) SpO2:  [93 %-97 %] 93 % (11/26 0400) Weight change:   Intake/Output from previous day: 11/25 0701 - 11/26 0700 In: 980 [P.O.:340; I.V.:640] Out: 950 [Urine:950]  Intake/Output from this shift:    Physical Exam: General appearance: alert, cooperative and no distress Neck: no adenopathy, no carotid bruit, no JVD, supple, symmetrical, trachea midline and thyroid not enlarged, symmetric, no tenderness/mass/nodules Lungs: clear to auscultation bilaterally Heart: regular rate and rhythm, S1, S2 normal, no murmur, click, rub or gallop Extremities: extremities normal, atraumatic, no cyanosis or edema and Right groin OK  Lab Results: Results for orders placed during the hospital encounter of 09/24/12 (from the past 48 hour(s))  GLUCOSE, CAPILLARY     Status: Abnormal   Collection Time   09/24/12  8:05 AM      Component Value Range Comment   Glucose-Capillary 163 (*) 70 - 99 mg/dL   CBC     Status: Abnormal   Collection Time   09/24/12  8:36 AM      Component Value Range Comment   WBC 10.5  4.0 - 10.5 K/uL    RBC 4.19 (*) 4.22 - 5.81 MIL/uL    Hemoglobin 13.7  13.0 - 17.0 g/dL    HCT 40.9  81.1 - 91.4 %    MCV 93.8  78.0 - 100.0 fL    MCH 32.7  26.0 - 34.0 pg    MCHC 34.9  30.0 - 36.0 g/dL    RDW 78.2  95.6 - 21.3 %    Platelets 131 (*) 150 - 400 K/uL   BASIC METABOLIC PANEL     Status: Abnormal   Collection Time   09/24/12  8:36 AM      Component Value Range Comment   Sodium 139  135 - 145 mEq/L    Potassium 4.4  3.5 - 5.1 mEq/L    Chloride 105  96 - 112 mEq/L    CO2 25  19 - 32 mEq/L    Glucose, Bld 163 (*) 70 - 99 mg/dL    BUN 19  6 - 23 mg/dL    Creatinine, Ser 0.86  0.50 - 1.35 mg/dL    Calcium 9.4  8.4 - 57.8 mg/dL    GFR calc non Af Amer 62 (*) >90 mL/min    GFR  calc Af Amer 72 (*) >90 mL/min   TROPONIN I     Status: Normal   Collection Time   09/24/12  8:36 AM      Component Value Range Comment   Troponin I <0.30  <0.30 ng/mL   PROTIME-INR     Status: Normal   Collection Time   09/24/12  8:36 AM      Component Value Range Comment   Prothrombin Time 14.5  11.6 - 15.2 seconds    INR 1.15  0.00 - 1.49   MAGNESIUM     Status: Normal   Collection Time   09/24/12  8:36 AM      Component Value Range Comment   Magnesium 2.0  1.5 - 2.5 mg/dL   POCT ACTIVATED CLOTTING TIME     Status: Normal   Collection Time   09/24/12 12:01 PM      Component Value Range Comment   Activated Clotting Time 119  TROPONIN I     Status: Normal   Collection Time   09/24/12  1:11 PM      Component Value Range Comment   Troponin I <0.30  <0.30 ng/mL   CK TOTAL AND CKMB     Status: Normal   Collection Time   09/24/12  1:11 PM      Component Value Range Comment   Total CK 130  7 - 232 U/L    CK, MB 3.2  0.3 - 4.0 ng/mL    Relative Index 2.5  0.0 - 2.5   TROPONIN I     Status: Normal   Collection Time   09/24/12  7:13 PM      Component Value Range Comment   Troponin I <0.30  <0.30 ng/mL   DIFFERENTIAL     Status: Normal   Collection Time   09/25/12  5:00 AM      Component Value Range Comment   Neutrophils Relative 69  43 - 77 %    Neutro Abs 6.9  1.7 - 7.7 K/uL    Lymphocytes Relative 16  12 - 46 %    Lymphs Abs 1.6  0.7 - 4.0 K/uL    Monocytes Relative 11  3 - 12 %    Monocytes Absolute 1.0  0.1 - 1.0 K/uL    Eosinophils Relative 4  0 - 5 %    Eosinophils Absolute 0.4  0.0 - 0.7 K/uL    Basophils Relative 0  0 - 1 %    Basophils Absolute 0.0  0.0 - 0.1 K/uL   COMPREHENSIVE METABOLIC PANEL     Status: Abnormal   Collection Time   09/25/12  5:00 AM      Component Value Range Comment   Sodium 142  135 - 145 mEq/L    Potassium 3.9  3.5 - 5.1 mEq/L    Chloride 107  96 - 112 mEq/L    CO2 27  19 - 32 mEq/L    Glucose, Bld 105 (*) 70 - 99 mg/dL    BUN  13  6 - 23 mg/dL    Creatinine, Ser 1.61  0.50 - 1.35 mg/dL    Calcium 8.8  8.4 - 09.6 mg/dL    Total Protein 5.6 (*) 6.0 - 8.3 g/dL    Albumin 3.4 (*) 3.5 - 5.2 g/dL    AST 18  0 - 37 U/L    ALT 15  0 - 53 U/L    Alkaline Phosphatase 52  39 - 117 U/L    Total Bilirubin 0.7  0.3 - 1.2 mg/dL    GFR calc non Af Amer 65 (*) >90 mL/min    GFR calc Af Amer 76 (*) >90 mL/min   TSH     Status: Normal   Collection Time   09/25/12  5:00 AM      Component Value Range Comment   TSH 0.843  0.350 - 4.500 uIU/mL   CBC     Status: Abnormal   Collection Time   09/25/12  5:00 AM      Component Value Range Comment   WBC 9.9  4.0 - 10.5 K/uL    RBC 4.07 (*) 4.22 - 5.81 MIL/uL    Hemoglobin 13.2  13.0 - 17.0 g/dL    HCT 04.5 (*) 40.9 - 52.0 %    MCV 94.6  78.0 - 100.0 fL    MCH 32.4  26.0 - 34.0 pg    MCHC 34.3  30.0 -  36.0 g/dL    RDW 16.1  09.6 - 04.5 %    Platelets 108 (*) 150 - 400 K/uL PLATELET COUNT CONFIRMED BY SMEAR  DIFFERENTIAL     Status: Normal   Collection Time   09/26/12  4:45 AM      Component Value Range Comment   Neutrophils Relative 67  43 - 77 %    Neutro Abs 6.0  1.7 - 7.7 K/uL    Lymphocytes Relative 16  12 - 46 %    Lymphs Abs 1.4  0.7 - 4.0 K/uL    Monocytes Relative 11  3 - 12 %    Monocytes Absolute 1.0  0.1 - 1.0 K/uL    Eosinophils Relative 5  0 - 5 %    Eosinophils Absolute 0.5  0.0 - 0.7 K/uL    Basophils Relative 0  0 - 1 %    Basophils Absolute 0.0  0.0 - 0.1 K/uL   BASIC METABOLIC PANEL     Status: Abnormal   Collection Time   09/26/12  4:45 AM      Component Value Range Comment   Sodium 138  135 - 145 mEq/L    Potassium 3.7  3.5 - 5.1 mEq/L    Chloride 104  96 - 112 mEq/L    CO2 25  19 - 32 mEq/L    Glucose, Bld 95  70 - 99 mg/dL    BUN 13  6 - 23 mg/dL    Creatinine, Ser 4.09  0.50 - 1.35 mg/dL    Calcium 8.8  8.4 - 81.1 mg/dL    GFR calc non Af Amer 74 (*) >90 mL/min    GFR calc Af Amer 86 (*) >90 mL/min     Imaging: Imaging results have been  reviewed  Assessment/Plan:   1. Principal Problem: 2.  *Chest pain at rest, negative MI, Stable Cardiac cath, prob GI source 3. Active Problems: 4.  HTN (hypertension) 5.  CAD (coronary artery disease), 3 cypher stents to RCA after MI, 2 cypher stents to LCX 2004 stable with cath 09/24/12  6.  Dyslipidemia 7.  GERD (gastroesophageal reflux disease) 8.  Odynophagia 9.   Time Spent Directly with Patient:  20 minutes  Length of Stay:  LOS: 2 days   Pt ruled out for MI. CT neg for PE. On PPI. Exam benign. For EGD today then transfer to tele and prob home tomorrow.  Runell Gess 09/26/2012, 8:03 AM

## 2012-09-26 NOTE — Progress Notes (Signed)
EGD today with large esophageal mass, which appears (at least in part) to be blood clot. Unclear etiology, but trauma (pill esophagitis, mucosa tear with extravasation of blood in the submucosal space?) is high on DDx.  Cannot exclude underlying malignancy. I sat with radiology and reviewed CTA chest performed on 11/24, but this area not clearly seen given timing of contrast.  Will repeat CT with contrast to better image esophagus, which radiology feels may be helpful. May need repeat EGD in a few days. Change to liquid meds when possible.  Liquid diet for now.  Remain upright after eating Discussed with family and patient at bedside.  Constipation -- bisacodyl suppository now.  If no action, then enema.

## 2012-09-26 NOTE — Progress Notes (Signed)
Back from the endoscopy dept awake and alert. Denied any discomfort. Continue to monitor.

## 2012-09-26 NOTE — Progress Notes (Signed)
Assisted to the bathroom after dulcolax suppository was given passed only flatus. Continue to monitor.

## 2012-09-27 ENCOUNTER — Encounter (HOSPITAL_COMMUNITY): Payer: Self-pay

## 2012-09-27 ENCOUNTER — Encounter (HOSPITAL_COMMUNITY): Payer: Self-pay | Admitting: Internal Medicine

## 2012-09-27 LAB — CBC
MCHC: 34.6 g/dL (ref 30.0–36.0)
RDW: 12.5 % (ref 11.5–15.5)

## 2012-09-27 LAB — DIFFERENTIAL
Basophils Absolute: 0 10*3/uL (ref 0.0–0.1)
Basophils Relative: 0 % (ref 0–1)
Eosinophils Relative: 6 % — ABNORMAL HIGH (ref 0–5)
Monocytes Absolute: 1 10*3/uL (ref 0.1–1.0)
Neutro Abs: 6.9 10*3/uL (ref 1.7–7.7)

## 2012-09-27 MED ORDER — LANSOPRAZOLE 30 MG PO CPDR: 30.0000 mg | DELAYED_RELEASE_CAPSULE | Freq: Every day | ORAL | Status: DC

## 2012-09-27 MED ORDER — PANTOPRAZOLE SODIUM 40 MG PO PACK
40.0000 mg | PACK | Freq: Every day | ORAL | Status: DC
  Filled 2012-09-27: qty 20

## 2012-09-27 NOTE — Progress Notes (Signed)
[facility]  Subjective: No complaints other than sitting in bed too long.  He is use to walking two miles per day.  Objective: Vital signs in last 24 hours: Temp:  [97.9 F (36.6 C)-98.6 F (37 C)] 98.1 F (36.7 C) (11/27 0800) Pulse Rate:  [64-77] 66  (11/27 0800) Resp:  [11-18] 18  (11/27 0800) BP: (101-131)/(49-85) 129/78 mmHg (11/27 0800) SpO2:  [92 %-98 %] 94 % (11/27 0800) Last BM Date: 09/23/12  Intake/Output from previous day: 11/26 0701 - 11/27 0700 In: 600 [P.O.:400; I.V.:200] Out: -  Intake/Output this shift:    Medications Current Facility-Administered Medications  Medication Dose Route Frequency Provider Last Rate Last Dose  . 0.9 %  sodium chloride infusion   Intravenous Continuous Maretta Bees, MD 50 mL/hr at 09/25/12 0700    . acetaminophen (TYLENOL) suppository 650 mg  650 mg Rectal Q6H PRN Maretta Bees, MD      . acetaminophen (TYLENOL) tablet 650 mg  650 mg Oral Q4H PRN Runell Gess, MD      . alum & mag hydroxide-simeth (MAALOX/MYLANTA) 200-200-20 MG/5ML suspension 30 mL  30 mL Oral Q6H PRN Maretta Bees, MD   30 mL at 09/24/12 1255  . aspirin suppository 300 mg  300 mg Rectal Daily Nada Boozer, NP      . bisacodyl (DULCOLAX) suppository 10 mg  10 mg Rectal Daily PRN Beverley Fiedler, MD   10 mg at 09/26/12 1653  . gi cocktail (Maalox,Lidocaine,Donnatal)  30 mL Oral TID PRN Nada Boozer, NP      . [COMPLETED] iohexol (OMNIPAQUE) 300 MG/ML solution 80 mL  80 mL Intravenous Once PRN Medication Radiologist, MD   80 mL at 09/26/12 1642  . metoprolol tartrate (LOPRESSOR) 25 mg/10 mL oral suspension 12.5 mg  12.5 mg Oral BID Nada Boozer, NP      . morphine 2 MG/ML injection 1 mg  1 mg Intravenous Q4H PRN Maretta Bees, MD   1 mg at 09/25/12 1341  . morphine 2 MG/ML injection 1 mg  1 mg Intravenous Q1H PRN Runell Gess, MD      . ondansetron [facility]) tablet 4 mg  4 mg Oral Q6H PRN Shanker Levora Dredge, MD       Or  . ondansetron (ZOFRAN) injection 4 mg  4 mg Intravenous Q6H PRN Maretta Bees, MD      . pantoprazole (PROTONIX) injection 40 mg  40 mg Intravenous Q12H Beverley Fiedler, MD   40 mg at 09/26/12 2143  . [DISCONTINUED] 0.9 %  sodium chloride infusion   Intravenous Continuous Dianah Field, PA 10 mL/hr at 09/25/12 1900    . [DISCONTINUED] aspirin EC tablet 325 mg  325 mg Oral Daily Maretta Bees, MD   325 mg at 09/26/12 0909  . [DISCONTINUED] atorvastatin (LIPITOR) tablet 20 mg  20 mg Oral q1800 Maretta Bees, MD   20 mg at 09/25/12 1700  . [DISCONTINUED] butamben-tetracaine-benzocaine (CETACAINE) spray    PRN Beverley Fiedler, MD   2 spray at 09/26/12 1225  . [DISCONTINUED] fentaNYL (SUBLIMAZE) injection    PRN Beverley Fiedler, MD   25 mcg at 09/26/12 1233  . [DISCONTINUED] isosorbide mononitrate (IMDUR) 24 hr tablet 30 mg  30 mg Oral Daily Chrystie Nose, MD   30 mg at 09/26/12 0909  . [DISCONTINUED] metoprolol tartrate (LOPRESSOR) tablet 12.5 mg  12.5 mg Oral BID Maretta Bees, MD  12.5 mg at 09/26/12 0909  . [DISCONTINUED] midazolam (VERSED) injection    PRN Beverley Fiedler, MD   1 mg at 09/26/12 1233  . [DISCONTINUED] pantoprazole (PROTONIX) EC tablet 40 mg  40 mg Oral BID AC Nada Boozer, NP   40 mg at 09/26/12 0817    PE: General appearance: alert, cooperative and no distress Lungs: clear to auscultation bilaterally and BS somewhat decreased in the RUL. Heart: regular rate and rhythm, S1, S2 normal, no murmur, click, rub or gallop Extremities: No LEE Pulses: 2+ and symmetric Skin: Warm and dry. Neurologic: Grossly normal  Lab Results:   Basename 09/27/12 0415 09/25/12 0500  WBC 9.7 9.9  HGB 13.3 13.2  HCT 38.4* 38.5*  PLT 105* 108*   BMET  Basename 09/26/12 0445 09/25/12 0500  NA 138 142  K 3.7 3.9  CL 104 107  CO2 25 27  GLUCOSE 95 105*  BUN 13 13  CREATININE 0.91 1.00  CALCIUM 8.8 8.8   CT CHEST WITH CONTRAST  Technique: Multidetector CT imaging of the  chest was performed  following the standard protocol during bolus administration of  intravenous contrast.  Contrast: 80mL OMNIPAQUE IOHEXOL 300 MG/ML SOLN  Comparison: CTA chest dated 09/24/2012  Findings: No suspicious pulmonary nodules. Biapical pleural  parenchymal scarring. Mild centrilobular emphysematous changes.  Trace bilateral pleural effusions with associated lower lobe  atelectasis. No pneumothorax.  Visualized thyroid is unremarkable.  The heart is normal in size. No pericardial effusion. Coronary  atherosclerosis. Atherosclerotic calcifications of the aortic  arch.  Small mediastinal lymph nodes measuring up to 9 mm short axis  (series 2/image 28). No suspicious hilar or axillary  lymphadenopathy.  Soft tissue/debris along the dependent/posterior wall of the  esophagus from the level of the carina to just above the esophageal  hiatus (sagittal image 52), corresponding to the endoscopic  abnormality.  Visualized upper abdomen is notable for cholecystectomy clips.  Visualized osseous structures are within normal limits.  IMPRESSION:  Soft tissue/debris along the dependent/posterior wall of the  esophagus, from the level of the carina to just above the  esophageal hiatus, corresponding to the endoscopic abnormality.  To what degree this reflects a mass versus hemorrhage versus  endoluminal debris is unclear by CT.   Assessment/Plan  Principal Problem:  *Chest pain at rest, negative MI, Stable Cardiac cath, prob GI source Active Problems:  HTN (hypertension)  CAD (coronary artery disease), 3 cypher stents to RCA after MI, 2 cypher stents to LCX 2004 stable with cath 09/24/12   Dyslipidemia  GERD (gastroesophageal reflux disease)  Odynophagia  Esophageal mass Thrombocytopenia  Plan:  SP left heart cath 09/24/12.  Widely patent stents.  Small diag with 80% stenosis, likely not responsible for symptoms.  Normal LVF.   Ruled out for MI.   SP EGD yesterday revealing  mass lesion.  CT Chest w/ contrast: Soft tissue/debris along the dependent/posterior wall of the  Esophagus.   Will await GIs input for disposition.  BP and HR stable.   Ambulate today.   LOS: 3 days    Devon Solis 09/27/2012 9:07 AM

## 2012-09-27 NOTE — Progress Notes (Signed)
Chaplain visited patient during his daily routine rounds. Patient was recovering from surgery on Monday. Patient was responsive and alert. Family members were visiting with patient during this follow-up visit. Patient and family expressed joy for patient's health improvement. Chaplain provided ministry of presence and empathetic listening. Chaplain also shared words of hope, encouragement and prayed with patient and family. Chaplain will continue to visit and provide spiritual care to both patient and family as needed. Patient and family expressed their appreciation for Chaplain's visit and continuous spiritual support.

## 2012-09-27 NOTE — Progress Notes (Addendum)
I seen and evaluated the patient this afternoon along with the Mr. Leron Croak, [place]. I agree with his findings, examination as well as impression recommendations.  Admitted for CP Sx - r/o for MI, cath without acute findings.  Appreciate GI eval & recommendations - awaiting GI MD final input, but anticipate OK for d/c with OP GI f/u per PA note.  CT scan does not seem to be helpful.  Will follow d/c recommendations.  Statin & Nitrate held yesterday.   With most recent PCI being 2004, he should be fine holding ASA for a few days to allow esophageal healing.  Would hope to restart ASAP at 81 mg daily (enteric coated)  BP & HR stable.  Is OK for d/c from Cardiac Standpoint -- just waiting GI MD final recs & decision re keep inpt or d/c.  Marykay Lex, M.D., M.S. [facility] [address] [place], [place]  [address]  [phone] Pager # [phone] 09/27/2012 3:35 PM

## 2012-09-27 NOTE — Progress Notes (Signed)
     [name] 09/27/2012, 8:36 AM  SUBJECTIVE:       Swallowing is fine with FL.  No recurrent chest pain  OBJECTIVE:         Vital signs in last 24 hours:    Temp:  [97.9 F (36.6 C)-98.6 F (37 C)] 98.1 F (36.7 C) (11/27 0800) Pulse Rate:  [64-77] 66  (11/27 0800) Resp:  [11-18] 18  (11/27 0800) BP: (101-131)/(49-85) 129/78 mmHg (11/27 0800) SpO2:  [92 %-98 %] 94 % (11/27 0800) Last BM Date: 09/23/12 General: NAD, thin but overall looks well and young for age   Heart: RRR Chest: clear B  No trouble breathing Abdomen: soft, NT, active BS, ND  Extremities: no pedal edema Neuro/Psych:  Pleasant alert.  No confusion.   Intake/Output from previous day: 11/26 0701 - 11/27 0700 In: 600 [P.O.:400; I.V.:200] Out: -   Intake/Output this shift:    Lab Results:  Basename 09/27/12 0415 09/25/12 0500  WBC 9.7 9.9  HGB 13.3 13.2  HCT 38.4* 38.5*  PLT 105* 108*   BMET  Basename 09/26/12 0445 09/25/12 0500  NA 138 142  K 3.7 3.9  CL 104 107  CO2 25 27  GLUCOSE 95 105*  BUN 13 13  CREATININE 0.91 1.00  CALCIUM 8.8 8.8   LFT  Basename 09/25/12 0500  PROT 5.6*  ALBUMIN 3.4*  AST 18  ALT 15  ALKPHOS 52  BILITOT 0.7  BILIDIR --  IBILI --   Studies/Results: Ct Chest W Contrast  09/26/2012 IMPRESSION: Soft tissue/debris along the dependent/posterior wall of the esophagus, from the level of the carina to just above the esophageal hiatus, corresponding to the endoscopic abnormality.  To what degree this reflects a mass versus hemorrhage versus endoluminal debris is unclear by CT.  No evidence of metastatic disease in the chest.   Original Report Authenticated By: Charline Bills, M.D.     ASSESMENT: *  Po induced chest pain.  EGD 11/26 with large esophageal mass, which appears (at least in part) to be blood clot.  CT scan negative for cancer.   Chest pain is resolved. Tolerating full liquid diet   PLAN: *  At most, puree diet.  liquid  Lanzoprazole 30 mg daily at discharge. I switched PPI to Protonix solution for duration of the admission.  *  Will arrange ROV with Dr Rhea Belton in early Dec, at which time redo EGD will then be arranged.  *  Prefer 81 mg ASA if it is necessary .  Best would be no ASA.  Also advised pt to take po meds with apple sauce , one or two at a time instead of by the handfull as he was doing.   I think Vitamin C ought to be dc'd, doubt this is an absolutely necessary med and the pills are large and acidic.    LOS: 3 days   Devon Solis  09/27/2012, 8:36 AM Pager: [phone]

## 2012-09-27 NOTE — Discharge Summary (Signed)
Physician Discharge Summary  Patient ID: Devon Solis MRN: [id number] DOB/AGE: 06/23/1923 78 y.o.  Admit date: 09/24/2012 Discharge date: 09/27/2012  Admission Diagnoses:  Chest pain  Discharge Diagnoses:  Principal Problem:  *Chest pain at rest, negative MI, Stable Cardiac cath, prob GI source Active Problems:  HTN (hypertension)  CAD (coronary artery disease), 3 cypher stents to RCA after MI, 2 cypher stents to LCX 2004 stable with cath 09/24/12   Dyslipidemia  GERD (gastroesophageal reflux disease)  Odynophagia  Esophageal mass   Discharged Condition: stable  Hospital Course:   78 year old Caucasian male with a past medical history of coronary artery disease status post numerous PCI in the past (see below for cardiac history), hypertension, dyslipidemia, GERD who was transferred from [facility] for evaluation of chest pain. Per patient he was in his usual state of health, he started having pressure retrosternal chest pain since last pm. He was sitting in his lounge chair when the pain came on. Patient describes the pain as pressure, last night it used to last for a minute and then slowly go away and then quickly recur. However over the past few hours it has started to reoccur less frequently. There is no radiation of the pain. There is associated nausea but no associated vomiting. There is no associated shortness of breath or diaphoresis. Because of persistent and recurrent nature of the pain, patient was transferred to [facility] for further evaluation and treatment. He stated that this was much worse than with his heart attack. Beginning NTG IV now.    Cardiac History: 2004 he had acute inferior MI and received 3 DES-Cypher stents to RCA. Later that year he received 2 DES Cypher stents to the proximal AV circumflex and 1st oblique marginal artery branch. Last nuc. Study was 2 years ago with a small scar in RCA territory, overall normal LV function. EF was 57%. Echo  with EF 50-55%, mild mitral prolapse, mild mitral insufficiency and mild TR and mild to mod aortic insuff. Previous history of Streptococcal bacteremia without clear evidence of endocarditis in 2011. Reported hx of infrarenal AAA-very small no sign of progression.     He was admitted and started on IV nitro.  Troponin checked serially.  Negative times three.  Left heart cath revealed insignificant coronary artery disease. He does have a high moderate-sized first diagonal branch which has an 80% stenosis at a bifurcation point in the mid vessel however this is a 1.7-2 mm vessel and most with excellent flow.  GI consult completed.  EGD revealed  Mass lesion extending 25 cm - 37 cm from the incisors with large blood clot  Normal Z-line was observed 39 cm from the incisors.  2 pills found in mid esophagus and advanced into stomach.  The mucosa of the stomach appeared normal.  Mild duodenal inflammation was found in the duodenal bulb.  The duodenal mucosa showed no abnormalities in the 2nd part of  the duodenum.  Ct of the chest revealed soft tissue/debris along the dependent/posterior wall of the esophagus, from the level of the carina to just above the esophageal hiatus.  The patient was seen by Dr. Herbie Baltimore and will be discharged home in stable condition.  He will follow up with Dr. Margretta Sidle with GI.     Consults: GI  Significant Diagnostic Studies:  Left heart cath HEMODYNAMICS:  AO SYSTOLIC/AO DIASTOLIC: 117/67  LV SYSTOLIC/LV DIASTOLIC: 115/10  ANGIOGRAPHIC RESULTS:  1. Left main; left main was short and normal  2. LAD; minor  irregularities. There was a small to moderate-sized diagonal branch which was high in the LAD which had a 80% stenosis in the midportion of the bifurcation point.  3. Left circumflex; left circumflex that was widely patent.  4. Right coronary artery; the right coronary artery was dominant with a patent stent  5. Left ventriculography; RAO left ventriculogram was performed using    25 mL of Visipaque dye at 12 mL/second. The overall LVEF estimated  60 % Without wall motion abnormalities  6. Supravalvular aortogram was performed using 20 cc of Visipaque dye at 20cc per second. The ascending aorta was normal in caliber without evidence of dissection.  IMPRESSION:Devon Solis has insignificant coronary artery disease. He does have a high moderate-sized first diagonal branch which has an 80% stenosis at a bifurcation point in the mid vessel however this is a 1.7-2 mm vessel and most with excellent flow I do not think this is responsible for his symptoms. Otherwise he has normal left ventricular function without segmental wall motion abnormalities with widely patent stents and no evidence of aortic dissection. I do not think that there is anything on his angiogram response with her symptoms and this suggests that his symptoms are noncardiac. The sheath was removed and pressure was held and the groin to achieve hemostasis. Patient left the Cath Lab in stable condition.  Runell Gess MD, [facility]  09/24/2012  ESOPHAGUS: Partially obstructing and partial circumferential mass  lesion was found extending from 25 cm from the incisors to 37 cm.  Difficult to fully assess, as a large portion appears to be blood  clot. The proximal edge is smooth in appearance, query ulcerative  esophagitis with large adherent blood clot. Biopsies were not  obtained due to concern for bleeding. A normal Z-line was observed  39 cm from the incisors. 2 pills found in the mid esophagus and  advanced into stomach.  STOMACH: The mucosa of the stomach appeared normal. Small hiatus  hernia.  DUODENUM: Mild duodenal inflammation was found in the duodenal bulb.  The duodenal mucosa showed no abnormalities in the 2nd part of  the duodenum. Retroflexed views revealed a hiatal hernia. The  scope was then withdrawn from the patient and the procedure  completed.  COMPLICATIONS: There were no complications.   ENDOSCOPIC IMPRESSION:  1. Mass lesion extending 25 cm - 37 cm from the incisors with  large blood clot (see above)  2. Normal Z-line was observed 39 cm from the incisors  3. 2 pills found in mid esophagus and advanced into stomach  4. The mucosa of the stomach appeared normal  5. Mild duodenal inflammation was found in the duodenal bulb  6. The duodenal mucosa showed no abnormalities in the 2nd part of  the duodenum  RECOMMENDATIONS:  1. CT Scan chest for further characterization of esophageal lesion  2. Liquid diet with pureed foods for now. Convert medications to  liquid where possible.  3. Avoid NSAIDs, anticoagulation if possible  4. Repeat EGD may be needed for possible biopsy vs reassessment for  healing  eSigned: Beverley Fiedler, MD 09/26/2012 1:00 PM  revised   CT CHEST WITH CONTRAST  Technique: Multidetector CT imaging of the chest was performed  following the standard protocol during bolus administration of  intravenous contrast.  Contrast: 80mL OMNIPAQUE IOHEXOL 300 MG/ML SOLN  Comparison: CTA chest dated 09/24/2012  Findings: No suspicious pulmonary nodules. Biapical pleural  parenchymal scarring. Mild centrilobular emphysematous changes.  Trace bilateral pleural effusions with associated lower lobe  atelectasis. No pneumothorax.  Visualized thyroid is unremarkable.  The heart is normal in size. No pericardial effusion. Coronary  atherosclerosis. Atherosclerotic calcifications of the aortic  arch.  Small mediastinal lymph nodes measuring up to 9 mm short axis  (series 2/image 28). No suspicious hilar or axillary  lymphadenopathy.  Soft tissue/debris along the dependent/posterior wall of the  esophagus from the level of the carina to just above the esophageal  hiatus (sagittal image 52), corresponding to the endoscopic  abnormality.  Visualized upper abdomen is notable for cholecystectomy clips.  Visualized osseous structures are within normal limits.  IMPRESSION:   Soft tissue/debris along the dependent/posterior wall of the  esophagus, from the level of the carina to just above the  esophageal hiatus, corresponding to the endoscopic abnormality.  To what degree this reflects a mass versus hemorrhage versus  endoluminal debris is unclear by CT.  No evidence of metastatic disease in the chest.  PORTABLE CHEST - 1 VIEW  Comparison: 02/10/2006  Findings: Normal heart size and vascularity. Clear lungs. No  focal pneumonia, collapse, consolidation, effusion, or  pneumothorax. Trachea is midline. Atherosclerosis of the aorta.  IMPRESSION:  No acute chest finding.   CBC    Component Value Date/Time   WBC 9.7 09/27/2012 0415   RBC 4.14* 09/27/2012 0415   HGB 13.3 09/27/2012 0415   HCT 38.4* 09/27/2012 0415   PLT 105* 09/27/2012 0415   MCV 92.8 09/27/2012 0415   MCH 32.1 09/27/2012 0415   MCHC 34.6 09/27/2012 0415   RDW 12.5 09/27/2012 0415   LYMPHSABS 1.3 09/27/2012 0415   MONOABS 1.0 09/27/2012 0415   EOSABS 0.5 09/27/2012 0415   BASOSABS 0.0 09/27/2012 0415    BMET    Component Value Date/Time   NA 138 09/26/2012 0445   K 3.7 09/26/2012 0445   CL 104 09/26/2012 0445   CO2 25 09/26/2012 0445   GLUCOSE 95 09/26/2012 0445   BUN 13 09/26/2012 0445   CREATININE 0.91 09/26/2012 0445   CALCIUM 8.8 09/26/2012 0445   GFRNONAA 74* 09/26/2012 0445   GFRAA 86* 09/26/2012 0445     Treatments:   Discharge Exam: Blood pressure 143/77, pulse 67, temperature 98.2 F (36.8 C), temperature source Oral, resp. rate 19, height 5\' 8"  (1.727 m), weight 62.1 kg (136 lb 14.5 oz), SpO2 95.00%.   Disposition:   Discharge Orders    Future Appointments: Provider: Department: Dept Phone: Center:   10/13/2012 3:00 PM Beverley Fiedler, MD [facility] [phone] [facility]     Future Orders Please Complete By Expires   Diet - low sodium heart healthy      Increase activity slowly      Discharge instructions      Comments:    Take pills one at a time with applesauce       Medication List     As of 09/27/2012  5:15 PM    STOP taking these medications         aspirin 81 MG tablet      multivitamin tablet      niacin 500 MG CR tablet   Commonly known as: NIASPAN      pantoprazole 40 MG tablet   Commonly known as: PROTONIX      vitamin C 1000 MG tablet      TAKE these medications         atorvastatin 20 MG tablet   Commonly known as: LIPITOR   Take 20 mg by mouth daily.  irbesartan 150 MG tablet   Commonly known as: AVAPRO   Take 150 mg by mouth at bedtime.      isosorbide mononitrate 60 MG 24 hr tablet   Commonly known as: IMDUR   Take 30 mg by mouth daily. Patient uses 30 mg in the am, and 30 mg in the pm.      lansoprazole 30 MG capsule   Commonly known as: PREVACID   Take 1 capsule (30 mg total) by mouth daily.      metoprolol tartrate 25 MG tablet   Commonly known as: LOPRESSOR   Take 25 mg by mouth 2 (two) times daily.      nitroGLYCERIN 0.4 MG SL tablet   Commonly known as: NITROSTAT   Place 0.4 mg under the tongue every 5 (five) minutes as needed.           Follow-up Information    Follow up with Beverley Fiedler, MD. (Call for appointment on Monday.)    Contact information:   [address] [place] [address] [phone]       Follow up with Thurmon Fair, MD. (Our office will all you with a follow up appt date and time.)    Contact information:   [address] [place] [address] [phone]          Signed: Wilburt Finlay 09/27/2012, 5:15 PM   I saw and evaluated the patient on the da of discharge along with the Mr. Leron Croak, [place]. I agree with his summary above.  Admitted for CP Sx - r/o for MI, cath without acute findings. Appreciate GI eval & recommendations -- OK for d/c with OP GI f/u per PA note. CT scan does not seem to be helpful. Will follow d/c recommendations.  Statin & Nitrate held yesterday.  With most recent PCI being  2004, he should be fine holding ASA for a few days to allow esophageal healing. Would hope to restart ASAP at 81 mg daily (enteric coated)  BP & HR stable.  Is OK for d/c from Cardiac Standpoint  Marykay Lex, M.D., M.S. [facility] [address] [place], [place]  [address]  [phone] Pager # [phone] 09/28/2012 8:02 AM

## 2012-09-27 NOTE — Progress Notes (Addendum)
I agree with the above documentation, including the assessment and plan. CT scan nonspecific.  Top of differential remains a traumatic esophageal injury with bleeding into submucosa space (?pill esophagitis), cannot exclude underlying cancer. Agree with repeat EGD, office visit prior to procedure in Dec [place] for d/c from my standpt

## 2012-10-11 ENCOUNTER — Encounter: Payer: Self-pay | Admitting: Internal Medicine

## 2012-10-13 ENCOUNTER — Encounter: Payer: Self-pay | Admitting: Internal Medicine

## 2012-10-13 ENCOUNTER — Ambulatory Visit (INDEPENDENT_AMBULATORY_CARE_PROVIDER_SITE_OTHER): Payer: Medicare Other | Admitting: Internal Medicine

## 2012-10-13 VITALS — BP 126/64 | HR 60 | Ht 65.25 in | Wt 136.4 lb

## 2012-10-13 DIAGNOSIS — R1310 Dysphagia, unspecified: Secondary | ICD-10-CM

## 2012-10-13 DIAGNOSIS — R0789 Other chest pain: Secondary | ICD-10-CM

## 2012-10-13 DIAGNOSIS — K229 Disease of esophagus, unspecified: Secondary | ICD-10-CM

## 2012-10-13 DIAGNOSIS — K228 Other specified diseases of esophagus: Secondary | ICD-10-CM

## 2012-10-13 NOTE — Progress Notes (Signed)
Patient ID: Devon Solis, male   DOB: [birth year], 78 y.o.   MRN: [id number]  SUBJECTIVE: HPI Devon Solis is a 78 year old male with a past medical history of CAD, hypertension, GERD, hyperlipidemia, atrial for ablation, CHF, who was recently seen during hospitalization for acute chest pain with dysphagia/odynophagia who is seen for followup. He is accompanied today by his wife and 2 daughters. He was initially seen during hospitalization in late November when he presented after developing acute chest pain with swallowing difficulty. Initially given his history, the concern was for cardiac chest pain but this was ruled out by cardiology. Upper endoscopy was performed on 09/26/2012 and revealed a partially obstructing and partially circumferential mass lesion in his esophagus extending from 25 cm to 37 cm from the incisors.  The etiology of this lesion was unclear, but it appeared to be in part a submucosal lesion with associated blood clot.  This did cause luminal narrowing.  He was started on daily PPI and a liquid/pured diet.  He has maintained this diet until followup today. He reports he is feeling significantly better and is able to swallow pured foods and liquids without difficulty. He is also swallowing his pills without difficulty. He is tiring of the liquid diet, and has lost about 8 pounds since this episode. He has slight chest pain which he describes as a discomfort which is most noticeable after his evening meal. He does report he frequently reclines after this meal, but is not recline after his other 2 meals in which his chest pain is not present.  He has noted some constipation with a change in diet but no blood in his stool or melena. He has remained off aspirin  Thinking back to family remembers that he took his medicines just before lying down on the evening prior to his symptoms starting. This is atypical as usually he takes his medications before dinner. Also the symptoms did start acutely and  first noticed on awakening one morning  Review of Systems  As per history of present illness, otherwise negative   Past Medical History  Diagnosis Date  . CAD (coronary artery disease) 09/24/2012  . Dyslipidemia 09/24/2012  . GERD (gastroesophageal reflux disease) 09/24/2012  . HTN (hypertension) 09/24/2012  . Hiatal hernia   . Arthritis   . Atrial fibrillation   . CHF (congestive heart failure)   . DVT (deep venous thrombosis)   . History of gallstones   . Bleeding stomach ulcer   . Esophageal stricture   . Abdominal aortic aneurysm     Current Outpatient Prescriptions  Medication Sig Dispense Refill  . atorvastatin (LIPITOR) 20 MG tablet Take 20 mg by mouth daily.      . irbesartan (AVAPRO) 150 MG tablet Take 150 mg by mouth at bedtime.      . isosorbide mononitrate (IMDUR) 60 MG 24 hr tablet Take 30 mg by mouth daily. Patient uses 30 mg in the am, and 30 mg in the pm.      . lansoprazole (PREVACID) 30 MG capsule Take 1 capsule (30 mg total) by mouth daily.  30 capsule  5  . metoprolol tartrate (LOPRESSOR) 25 MG tablet Take 25 mg by mouth 2 (two) times daily.      . nitroGLYCERIN (NITROSTAT) 0.4 MG SL tablet Place 0.4 mg under the tongue every 5 (five) minutes as needed.        Allergies  Allergen Reactions  . Penicillins     Family History  Problem Relation Age of  Onset  . Breast cancer Brother   . Colon polyps Daughter     x 2  . Diabetes Father   . Diabetes Daughter   . Diabetes Brother   . Liver disease Daughter   . Aneurysm Daughter     AAA    History  Substance Use Topics  . Smoking status: Former Smoker    Types: Cigarettes    Quit date: 08/18/1965  . Smokeless tobacco: Never Used  . Alcohol Use: No    OBJECTIVE: BP 126/64  Pulse 60  Ht 5' 5.25" (1.657 m)  Wt 136 lb 6 oz (61.859 kg)  BMI 22.52 kg/m2 Constitutional: Well-developed and well-nourished. No distress. HEENT: Normocephalic and atraumatic. Oropharynx is clear and moist. No  oropharyngeal exudate. Conjunctivae are normal. No scleral icterus. Neck: Neck supple. Trachea midline. Cardiovascular: Irregularly irregular Pulmonary/chest: Effort normal and breath sounds normal. No wheezing, rales or rhonchi. Abdominal: Soft, nontender, nondistended. Bowel sounds active throughout. There are no masses palpable.  Extremities: no clubbing, cyanosis, or edema Lymphadenopathy: No cervical adenopathy noted. Neurological: Alert and oriented to person place and time. Skin: Skin is warm and dry. No rashes noted. Psychiatric: Normal mood and affect. Behavior is normal.  Labs and Imaging -- CBC    Component Value Date/Time   WBC 9.7 09/27/2012 0415   RBC 4.14* 09/27/2012 0415   HGB 13.3 09/27/2012 0415   HCT 38.4* 09/27/2012 0415   PLT 105* 09/27/2012 0415   MCV 92.8 09/27/2012 0415   MCH 32.1 09/27/2012 0415   MCHC 34.6 09/27/2012 0415   RDW 12.5 09/27/2012 0415   LYMPHSABS 1.3 09/27/2012 0415   MONOABS 1.0 09/27/2012 0415   EOSABS 0.5 09/27/2012 0415   BASOSABS 0.0 09/27/2012 0415   Chest CT with contrast 09/26/2012 Clinical Data: Mass lesion in esophagus with large blood clot on endoscopy   CT CHEST WITH CONTRAST   Technique:  Multidetector CT imaging of the chest was performed following the standard protocol during bolus administration of intravenous contrast.   Contrast: 80mL OMNIPAQUE IOHEXOL 300 MG/ML  SOLN   Comparison: CTA chest dated 09/24/2012   Findings: No suspicious pulmonary nodules.  Biapical pleural parenchymal scarring.  Mild centrilobular emphysematous changes. Trace bilateral pleural effusions with associated lower lobe atelectasis.  No pneumothorax.   Visualized thyroid is unremarkable.   The heart is normal in size.  No pericardial effusion.  Coronary atherosclerosis.  Atherosclerotic calcifications of the aortic arch.   Small mediastinal lymph nodes measuring up to 9 mm short axis (series 2/image 28).  No suspicious hilar or  axillary lymphadenopathy.   Soft tissue/debris along the dependent/posterior wall of the esophagus from the level of the carina to just above the esophageal hiatus (sagittal image 52), corresponding to the endoscopic abnormality.   Visualized upper abdomen is notable for cholecystectomy clips.   Visualized osseous structures are within normal limits.   IMPRESSION: Soft tissue/debris along the dependent/posterior wall of the esophagus, from the level of the carina to just above the esophageal hiatus, corresponding to the endoscopic abnormality.   To what degree this reflects a mass versus hemorrhage versus endoluminal debris is unclear by CT.   No evidence of metastatic disease in the chest.   ASSESSMENT AND PLAN:  78 year old male with a past medical history of CAD, hypertension, GERD, hyperlipidemia, atrial for ablation, CHF, who was recently seen during hospitalization for acute chest pain with dysphagia/odynophagia who is seen for followup.  1.  Esophageal lesion/trauma -- given the acuity of his presentation,  the most likely etiology to the large lesion seen in his esophagus is trauma, and less likely malignancy. There is question of whether he had a pill-induced esophageal injury made worsened by subsequent retching and vomiting leading to mucosal tearing with subsequent extravasation of blood in the submucosal space.  There was no evidence for perforation. Chest CT performed in the hospital did not show definitive mass, lymphadenopathy, or evidence of any metastatic process.  I would like to evaluate the function of his esophagus with barium swallow, and is grossly normal at this point a pill tablet.  This will also help to determine if the lesion is still present. If it is he will need upper endoscopy for repeat visualization and possible biopsy. At this point I think he can advance his diet slightly by adding and very soft food such as scrambled eggs, oatmeal, soft fruits such as  bananas. I would like him to avoid fiber rich foods, vegetables and less they are pured, breads and meats (again unless pured).  I would like him to continue lansoprazole, which she is instructed to take 30 minutes prior to his last meal of the day.  I also would like for him to supplement his diet with ensure or boost such that he's getting enough calories. He is reminded to remain upright for at least 30 minutes after eating and she food extremely well.  2.  Constipation -- likely as a result of altered diet. He'll add MiraLAX 17 g daily.

## 2012-10-13 NOTE — Patient Instructions (Addendum)
You have been scheduled for an endoscopy with propofol. Please follow written instructions given to you at your visit today. If you use inhalers (even only as needed) or a CPAP machine, please bring them with you on the day of your procedure.   You have been scheduled for a barium Swallow at [facility] on  10/17/2012  At 10:00 arrive 15 minutes prior.   Please report to radiology. If you cannot make this appointment please call [phone]  Continue taking lansoprazole and miralax daily, remain upright after eating

## 2012-10-17 ENCOUNTER — Ambulatory Visit (HOSPITAL_COMMUNITY)
Admission: RE | Admit: 2012-10-17 | Discharge: 2012-10-17 | Disposition: A | Discharge status: Home / Self Care | Payer: Medicare Other | Source: Ambulatory Visit | Attending: Internal Medicine | Admitting: Internal Medicine

## 2012-10-17 DIAGNOSIS — K229 Disease of esophagus, unspecified: Secondary | ICD-10-CM | POA: Insufficient documentation

## 2012-10-17 DIAGNOSIS — K228 Other specified diseases of esophagus: Secondary | ICD-10-CM

## 2012-10-18 ENCOUNTER — Telehealth: Payer: Self-pay | Admitting: Internal Medicine

## 2012-10-18 NOTE — Telephone Encounter (Signed)
Barium swallow shows no mass or esophagitis in the esophagus, which is very good news   I still think EGD needs to be performed to ensure complete healing   I want him to continue the current dose of PPI until EGD   The 13 mm barium tablet passed quickly into the stomach without delay   He can likely advance his diet further, but I would continue to avoid hard breads and meat, he also needs to continue to choose extremely well      Informed pt's daughter and pt was sitting beside her; pt is hard of hearing. Dr Rhea Belton feels the BS rep[ort was good news, but they need to do the EGD to ensure his esophagus has healed. Advised her to have pt advance his diet, but avoid breads and meats and hard foods. They may call back for questions; she stated understanding.

## 2012-11-02 ENCOUNTER — Ambulatory Visit (AMBULATORY_SURGERY_CENTER): Payer: Medicare Other | Admitting: Internal Medicine

## 2012-11-02 ENCOUNTER — Other Ambulatory Visit: Payer: Self-pay | Admitting: *Deleted

## 2012-11-02 ENCOUNTER — Encounter: Payer: Self-pay | Admitting: Internal Medicine

## 2012-11-02 VITALS — BP 121/57 | HR 54 | Temp 97.0°F | Resp 16 | Ht 65.0 in | Wt 136.0 lb

## 2012-11-02 DIAGNOSIS — K228 Other specified diseases of esophagus: Secondary | ICD-10-CM

## 2012-11-02 DIAGNOSIS — R1310 Dysphagia, unspecified: Secondary | ICD-10-CM

## 2012-11-02 DIAGNOSIS — K219 Gastro-esophageal reflux disease without esophagitis: Secondary | ICD-10-CM

## 2012-11-02 DIAGNOSIS — K229 Disease of esophagus, unspecified: Secondary | ICD-10-CM

## 2012-11-02 DIAGNOSIS — R0789 Other chest pain: Secondary | ICD-10-CM

## 2012-11-02 MED ORDER — SODIUM CHLORIDE 0.9 % IV SOLN: 500.0000 mL | INTRAVENOUS | Status: DC

## 2012-11-02 MED ORDER — HYDROCORTISONE ACETATE 25 MG RE SUPP: 25.0000 mg | Freq: Two times a day (BID) | RECTAL | Status: DC

## 2012-11-02 NOTE — Op Note (Signed)
[facility] [address] [place], [address]   ENDOSCOPY PROCEDURE REPORT  PATIENT: Devon, Solis  MR#: [id number] BIRTHDATE: 1922/11/09 , 89  yrs. old GENDER: Male ENDOSCOPIST: Beverley Fiedler, MD REFERRED BY: PROCEDURE DATE:  11/02/2012 PROCEDURE:  EGD, diagnostic ASA CLASS:     Class III INDICATIONS:  Chest pain.   odynophagia.  surveillance of previously seen esophageal lesion. MEDICATIONS: MAC sedation, administered by CRNA and propofol (Diprivan) 100mg  IV TOPICAL ANESTHETIC: Cetacaine Spray  DESCRIPTION OF PROCEDURE: After the risks benefits and alternatives of the procedure were thoroughly explained, informed consent was obtained.  The LB GIF-H180 [id number] endoscope was introduced through the mouth and advanced to the second portion of the duodenum. Without limitations.  The instrument was slowly withdrawn as the mucosa was fully examined.     ESOPHAGUS: The mucosa of the esophagus appeared normal.   A 2 cm hiatal hernia was noted.  STOMACH: The mucosa of the stomach appeared normal.  DUODENUM: The duodenal mucosa showed no abnormalities in the bulb and second portion of the duodenum.  Retroflexed views revealed a hiatal hernia.     The scope was then withdrawn from the patient and the procedure completed.  COMPLICATIONS: There were no complications. ENDOSCOPIC IMPRESSION: 1.   The mucosa of the esophagus appeared normal 2.   2 cm hiatal hernia 3.   The mucosa of the stomach appeared normal 4.   The duodenal mucosa showed no abnormalities in the bulb and second portion of the duodenum  RECOMMENDATIONS: 1.  Okay to return to regular diet now 2.  Would continue reflux precautions (remain upright approximately 90 minutes after meals) 3.  Continue lansoprazole 30 mg once daily for acid suppression 4.  From GI standpoint, okay for anti-platelets or anti-coagulation if felt indicated per cardiology. 5.  Office follow-up as needed  eSigned:  Beverley Fiedler, MD 11/02/2012 2:08 PM     MW:[id number] Allyson Sabal, MD and The Patient  and Dr. Elyn Peers, [place])

## 2012-11-02 NOTE — Patient Instructions (Addendum)
Per Dr. Rhea Belton okay to return to a regular diet now.  Would continue reflux precautions such as remaining upright approximately 90 minutes after meals.  Continue lansoprazole 30 mg once daily for acid suppression.  From GI standpoint, okay for anti-platelets or anti-coagulation if felt indicated per cardiologist.  You may resume your current medications today.  Please call if any questions or concerns.      YOU HAD AN ENDOSCOPIC PROCEDURE TODAY AT THE [facility]: Refer to the procedure report that was given to you for any specific questions about what was found during the examination.  If the procedure report does not answer your questions, please call your gastroenterologist to clarify.  If you requested that your care partner not be given the details of your procedure findings, then the procedure report has been included in a sealed envelope for you to review at your convenience later.  YOU SHOULD EXPECT: Some feelings of bloating in the abdomen. Passage of more gas than usual.  Walking can help get rid of the air that was put into your GI tract during the procedure and reduce the bloating. If you had a lower endoscopy (such as a colonoscopy or flexible sigmoidoscopy) you may notice spotting of blood in your stool or on the toilet paper. If you underwent a bowel prep for your procedure, then you may not have a normal bowel movement for a few days.  DIET: Your first meal following the procedure should be a light meal and then it is ok to progress to your normal diet.  A half-sandwich or bowl of soup is an example of a good first meal.  Heavy or fried foods are harder to digest and may make you feel nauseous or bloated.  Likewise meals heavy in dairy and vegetables can cause extra gas to form and this can also increase the bloating.  Drink plenty of fluids but you should avoid alcoholic beverages for 24 hours.  ACTIVITY: Your care partner should take you home directly after the procedure.   You should plan to take it easy, moving slowly for the rest of the day.  You can resume normal activity the day after the procedure however you should NOT DRIVE or use heavy machinery for 24 hours (because of the sedation medicines used during the test).    SYMPTOMS TO REPORT IMMEDIATELY: A gastroenterologist can be reached at any hour.  During normal business hours, 8:30 AM to 5:00 PM Monday through Friday, call [phone].  After hours and on weekends, please call the GI answering service at [phone] who will take a message and have the physician on call contact you.     Following upper endoscopy (EGD)  Vomiting of blood or coffee ground material  New chest pain or pain under the shoulder blades  Painful or persistently difficult swallowing  New shortness of breath  Fever of 100F or higher  Black, tarry-looking stools  FOLLOW UP: If any biopsies were taken you will be contacted by phone or by letter within the next 1-3 weeks.  Call your gastroenterologist if you have not heard about the biopsies in 3 weeks.  Our staff will call the home number listed on your records the next business day following your procedure to check on you and address any questions or concerns that you may have at that time regarding the information given to you following your procedure. This is a courtesy call and so if there is no answer at the home number  and we have not heard from you through the emergency physician on call, we will assume that you have returned to your regular daily activities without incident.  SIGNATURES/CONFIDENTIALITY: You and/or your care partner have signed paperwork which will be entered into your electronic medical record.  These signatures attest to the fact that that the information above on your After Visit Summary has been reviewed and is understood.  Full responsibility of the confidentiality of this discharge information lies with you and/or your care-partner.

## 2012-11-02 NOTE — Progress Notes (Signed)
Patient did not experience any of the following events: a burn prior to discharge; a fall within the facility; wrong site/side/patient/procedure/implant event; or a hospital transfer or hospital admission upon discharge from the facility. (G8907) Patient did not have preoperative order for IV antibiotic SSI prophylaxis. (G8918)  

## 2012-11-02 NOTE — Progress Notes (Signed)
No complaints noted in the recovery room. Maw   

## 2012-11-03 ENCOUNTER — Telehealth: Payer: Self-pay | Admitting: *Deleted

## 2012-11-03 NOTE — Telephone Encounter (Signed)
  Follow up Call-  Call back number 11/02/2012  Post procedure Call Back phone  # [phone] Daugthers number Devon Solis   Permission to leave phone message Yes     Patient questions:  Do you have a fever, pain , or abdominal swelling? no Pain Score  0 *  Have you tolerated food without any problems? yes  Have you been able to return to your normal activities? yes  Do you have any questions about your discharge instructions: Diet   no Medications  no Follow up visit  no  Do you have questions or concerns about your Care? no  Actions: * If pain score is 4 or above: No action needed, pain <4.

## 2013-04-02 ENCOUNTER — Encounter: Payer: Self-pay | Admitting: Cardiology

## 2013-04-03 ENCOUNTER — Ambulatory Visit (INDEPENDENT_AMBULATORY_CARE_PROVIDER_SITE_OTHER): Payer: Medicare Other | Admitting: Cardiovascular Disease

## 2013-04-03 ENCOUNTER — Encounter: Payer: Self-pay | Admitting: Cardiovascular Disease

## 2013-04-03 VITALS — BP 136/82 | HR 55 | Resp 16 | Ht 67.0 in | Wt 136.0 lb

## 2013-04-03 DIAGNOSIS — I714 Abdominal aortic aneurysm, without rupture, unspecified: Secondary | ICD-10-CM

## 2013-04-03 DIAGNOSIS — I340 Nonrheumatic mitral (valve) insufficiency: Secondary | ICD-10-CM

## 2013-04-03 DIAGNOSIS — E785 Hyperlipidemia, unspecified: Secondary | ICD-10-CM

## 2013-04-03 DIAGNOSIS — I10 Essential (primary) hypertension: Secondary | ICD-10-CM

## 2013-04-03 DIAGNOSIS — I2510 Atherosclerotic heart disease of native coronary artery without angina pectoris: Secondary | ICD-10-CM

## 2013-04-03 DIAGNOSIS — Z9861 Coronary angioplasty status: Secondary | ICD-10-CM

## 2013-04-03 DIAGNOSIS — I059 Rheumatic mitral valve disease, unspecified: Secondary | ICD-10-CM

## 2013-04-03 DIAGNOSIS — K219 Gastro-esophageal reflux disease without esophagitis: Secondary | ICD-10-CM

## 2013-04-03 NOTE — Patient Instructions (Addendum)
Your physician recommends that you schedule a follow-up appointment in: 1 year (June 2015). No changes in current medical therapy.

## 2013-04-23 DIAGNOSIS — I714 Abdominal aortic aneurysm, without rupture: Secondary | ICD-10-CM | POA: Insufficient documentation

## 2013-04-23 DIAGNOSIS — I340 Nonrheumatic mitral (valve) insufficiency: Secondary | ICD-10-CM | POA: Insufficient documentation

## 2013-04-23 NOTE — Assessment & Plan Note (Signed)
Currently asymptomatic. Inferoseptal scar without ischemia by myocardial perfusion study 2011. No need for percutaneous revascularization since 2004. Most recent angiography 2011.

## 2013-04-23 NOTE — Assessment & Plan Note (Signed)
Previously reported with a maximum diameter of 2.4 cm by ultrasound, his most recent study in June of 2013 does not describe any aneurysm. The maximum aortic diameter was 2.8 cm.

## 2013-04-23 NOTE — Assessment & Plan Note (Addendum)
Prolapse of the anterior mitral leaflet with moderate mitral insufficiency. Echo evidence of left ventricular enlargement and no clinical signs or symptoms of congestive heart

## 2013-04-23 NOTE — Assessment & Plan Note (Signed)
History of severe GI bleeding in the past

## 2013-04-23 NOTE — Progress Notes (Signed)
Patient ID: Devon Solis, male   DOB: [birth year], 79 y.o.   MRN: [id number]     Reason for office visit followup for coronary artery disease, hyperlipidemia   As always Devon Solis is accompanied by several of his daughters. He has done well from a cardiovascular point of view. He has a variety of complaints today none of them which appear to be related to his heart or vascular system. He has had a rash and a protracted episode of bronchitis for which she received steroid therapy. This led to an increase in his glucose levels and a diagnosis of diabetes mellitus. He has also developed tendinitis in his left Achilles tendon.  He does not have any chest pain or shortness of breath. He leads a fairly sedentary lifestyle.  He has an extensive history of coronary problems and over the years has received 3 stents in the right coronary artery and 2 in the left circumflex coronary artery all of them drug-eluting stents. His most recent cardiac catheterization that led to intervention was years ago in 2004. Followup coronary angiograms performed in 2007 and 2013 show that his previously placed stents were widely patent. On both occasions there is mention of a 80% stenosis in the bifurcation of a high first diagonal artery (which was felt to be less than 2 mm in diameter and therefore not amenable to percutaneous revascularization).   Allergies  Allergen Reactions  . Penicillins     Current Outpatient Prescriptions  Medication Sig Dispense Refill  . Ascorbic Acid (VITAMIN C) 1000 MG tablet Take 1,000 mg by mouth daily.      Marland Kitchen atorvastatin (LIPITOR) 20 MG tablet Take 20 mg by mouth daily.      . irbesartan (AVAPRO) 150 MG tablet Take 150 mg by mouth at bedtime.      . isosorbide mononitrate (IMDUR) 60 MG 24 hr tablet Take 30 mg by mouth daily. Patient uses 30 mg in the am, and 30 mg in the pm.      . metoprolol tartrate (LOPRESSOR) 25 MG tablet Take 12.5 mg by mouth 2 (two) times daily.       . Multiple  Vitamin (MULTIVITAMIN WITH MINERALS) TABS Take 1 tablet by mouth daily.      Marland Kitchen NIASPAN 500 MG CR tablet Take 500 mg by mouth daily.      . nitroGLYCERIN (NITROSTAT) 0.4 MG SL tablet Place 0.4 mg under the tongue every 5 (five) minutes as needed.      . pantoprazole (PROTONIX) 40 MG tablet Take 40 mg by mouth daily.      . hydrocortisone (ANUSOL-HC) 25 MG suppository Place 1 suppository (25 mg total) rectally every 12 (twelve) hours.  12 suppository  1  . lansoprazole (PREVACID) 30 MG capsule Take 1 capsule (30 mg total) by mouth daily.  30 capsule  5   No current facility-administered medications for this visit.    Past Medical History  Diagnosis Date  . CAD (coronary artery disease) 09/24/2012    extensive stents/PCI to RCA and LCX, last cath 09/2012- high moderate sized 1st diag. with 80% stenosis. no nucs since cath  . Dyslipidemia 09/24/2012  . GERD (gastroesophageal reflux disease) 09/24/2012  . HTN (hypertension) 09/24/2012  . Hiatal hernia   . Arthritis   . Atrial fibrillation   . CHF (congestive heart failure)   . DVT (deep venous thrombosis)   . History of gallstones   . Bleeding stomach ulcer   . Esophageal stricture   . Abdominal  aortic aneurysm     small, last ulrasound 04/10/12,   . Mild aortic insufficiency     last echo 04/2012,  EF 50-55%, moderate MR, mild MVP    Past Surgical History  Procedure Laterality Date  . Coronary angioplasty  05/2003    3 cypher stents placed urgently for inf. MI to -RCA, 2 cypher stents -OM1 & 1 cypher stent to LCX  . Cholecystectomy    . Esophagogastroduodenoscopy (egd) with esophageal dilation  09/26/2012    Procedure: ESOPHAGOGASTRODUODENOSCOPY (EGD) WITH ESOPHAGEAL DILATION;  Surgeon: Beverley Fiedler, MD;  Location: [facility] ENDOSCOPY;  Service: Gastroenterology;  Laterality: N/A;  . Appendectomy    . Nasal sinus surgery    . Cardiac catheterization  02/10/06    Patent stents residual LAD disease  . Cardiac catheterization  09/24/2012     patent stents, 80% 1st diag branch though not felt responsible for symptoms.    Family History  Problem Relation Age of Onset  . Colon polyps Daughter     x 2  . Diabetes Father   . Heart disease Father     died at 53  . Diabetes Daughter   . Diabetes Brother   . Liver disease Daughter   . Aneurysm Daughter     AAA  . Cancer Mother     died at 81  . Breast cancer Sister     History   Social History  . Marital Status: Widowed    Spouse Name: N/A    Number of Children: 4  . Years of Education: N/A   Occupational History  . retired    Social History Main Topics  . Smoking status: Former Smoker    Types: Cigarettes    Quit date: 08/18/1965  . Smokeless tobacco: Never Used  . Alcohol Use: No  . Drug Use: No  . Sexually Active: Not on file   Other Topics Concern  . Not on file   Social History Narrative  . No narrative on file    Review of systems: The patient specifically denies any chest pain at rest exertion, dyspnea at rest or with exertion, orthopnea, paroxysmal nocturnal dyspnea, syncope, palpitations, focal neurological deficits, intermittent claudication, lower extremity edema, unexplained weight gain, cough, hemoptysis or wheezing.   PHYSICAL EXAM BP 136/82  Pulse 55  Resp 16  Ht 5\' 7"  (1.702 m)  Wt 61.689 kg (136 lb)  BMI 21.3 kg/m2  General: Alert, oriented x3, no distress Head: no evidence of trauma, PERRL, EOMI, no exophtalmos or lid lag, no myxedema, no xanthelasma; normal ears, nose and oropharynx Neck: normal jugular venous pulsations and no hepatojugular reflux; brisk carotid pulses without delay and no carotid bruits Chest: clear to auscultation, no signs of consolidation by percussion or palpation, normal fremitus, symmetrical and full respiratory excursions Cardiovascular: normal position and quality of the apical impulse, regular rhythm, normal first and second heart sounds, rate 2/6 early peaking systolic ejection murmur in the aortic  focus, grade 1/6 whole systolic apical murmur, rubs or gallops Abdomen: no tenderness or distention, no masses by palpation, no abnormal pulsatility or arterial bruits, normal bowel sounds, no hepatosplenomegaly Extremities: no clubbing, cyanosis or edema; 2+ radial, ulnar and brachial pulses bilaterally; 2+ right femoral, posterior tibial and dorsalis pedis pulses; 2+ left femoral, posterior tibial and dorsalis pedis pulses; no subclavian or femoral bruits Neurological: grossly nonfocal   EKG: On his bradycardia, left axis deviation, unchanged  BMET    Component Value Date/Time   NA 138 09/26/2012  0445   K 3.7 09/26/2012 0445   CL 104 09/26/2012 0445   CO2 25 09/26/2012 0445   GLUCOSE 95 09/26/2012 0445   BUN 13 09/26/2012 0445   CREATININE 0.91 09/26/2012 0445   CALCIUM 8.8 09/26/2012 0445   GFRNONAA 74* 09/26/2012 0445   GFRAA 86* 09/26/2012 0445     ASSESSMENT AND PLAN HTN (hypertension) Fair control  CAD (coronary artery disease), 3 cypher stents to RCA after MI, 2 cypher stents to LCX 2004 stable with cath 09/24/12  Currently asymptomatic. Inferoseptal scar without ischemia by myocardial perfusion study 2011. No need for percutaneous revascularization since 2004. Most recent angiography 2011.  Dyslipidemia His lipid-lowering regimen was that nonetheless it appears appropriate but I do not have any recent values. We'll try to obtain these from his primary care physician  GERD (gastroesophageal reflux disease) History of severe GI bleeding in the past  Mitral insufficiency Previously reported with a maximum diameter of 2.4 cm by ultrasound, his most recent study in June of 2013 does not describe any aneurysm. The maximum aortic diameter was 2.8 cm.  AAA (abdominal aortic aneurysm) Previously reported with a maximum diameter of 2.4 cm by ultrasound, his most recent study in June of 2013 does not describe any aneurysm. The maximum aortic diameter was 2.8 cm.   No orders  of the defined types were placed in this encounter.   Meds ordered this encounter  Medications  . NIASPAN 500 MG CR tablet    Sig: Take 500 mg by mouth daily.  . Ascorbic Acid (VITAMIN C) 1000 MG tablet    Sig: Take 1,000 mg by mouth daily.  . Multiple Vitamin (MULTIVITAMIN WITH MINERALS) TABS    Sig: Take 1 tablet by mouth daily.    Junious Silk, MD, [facility] [phone] office [phone] pager

## 2013-04-23 NOTE — Assessment & Plan Note (Signed)
Fair control.

## 2013-04-23 NOTE — Assessment & Plan Note (Signed)
His lipid-lowering regimen was that nonetheless it appears appropriate but I do not have any recent values. We'll try to obtain these from his primary care physician

## 2013-05-10 ENCOUNTER — Telehealth (HOSPITAL_COMMUNITY): Payer: Self-pay | Admitting: Cardiovascular Disease

## 2013-05-10 NOTE — Telephone Encounter (Signed)
Left message to call regarding scheduling appt

## 2013-06-30 ENCOUNTER — Other Ambulatory Visit: Payer: Self-pay | Admitting: Cardiovascular Disease

## 2013-07-03 NOTE — Telephone Encounter (Signed)
Rx was sent to pharmacy electronically. 

## 2013-09-06 IMAGING — RF DG ESOPHAGUS
12 of 18 series · 15 of 24 positions shown · non-contrast
Comparison: None.

CLINICAL DATA: Recent pill impaction.

ESOPHOGRAM/BARIUM SWALLOW
TECHNIQUE: Combined double contrast and single contrast
examination performed using effervescent crystals, thick barium
liquid, and thin barium liquid.
Fluoroscopy time:  1.22 minutes slow pulsed fluoroscopy

[Series 1: run · 2 of 12 slices shown (1 of 12)]
[im 1/12]
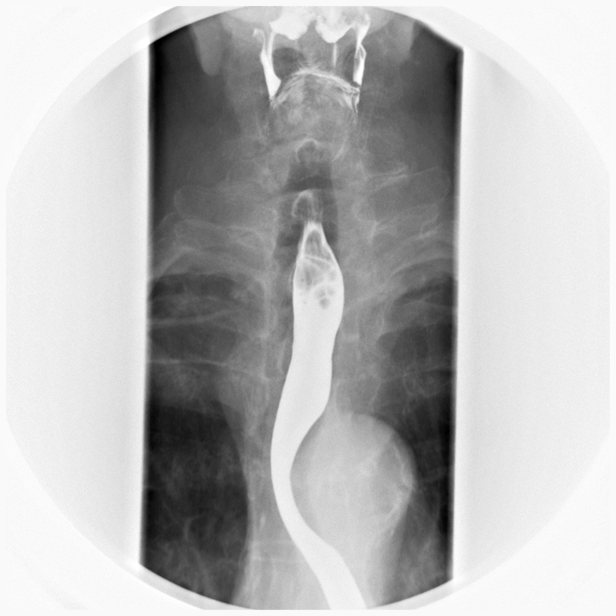
[im 12/12]
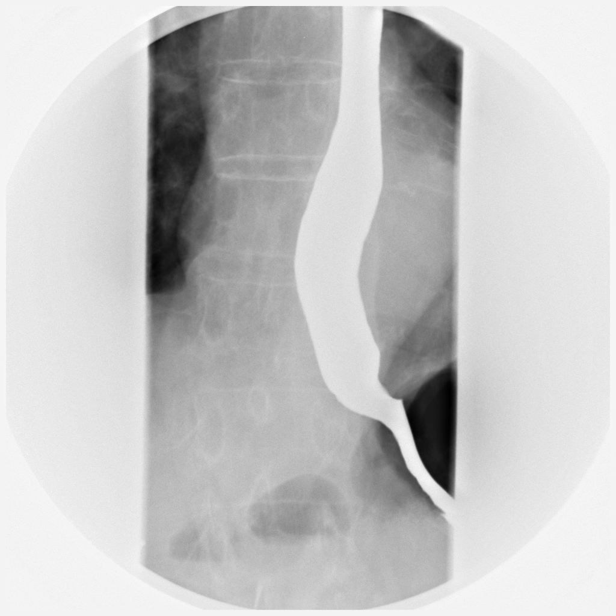

[Series 3: run · 1 of 2 slices shown (2 of 12)]
[im 1/2]
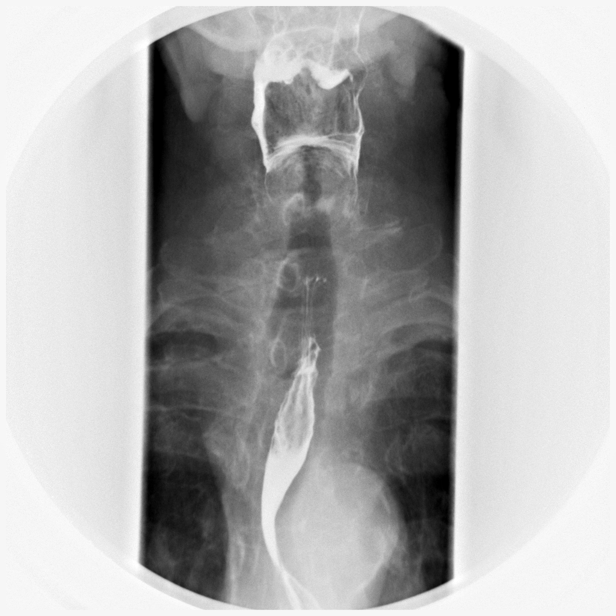

[Series 4: run · 1 of 2 slices shown (3 of 12)]
[im 1/2]
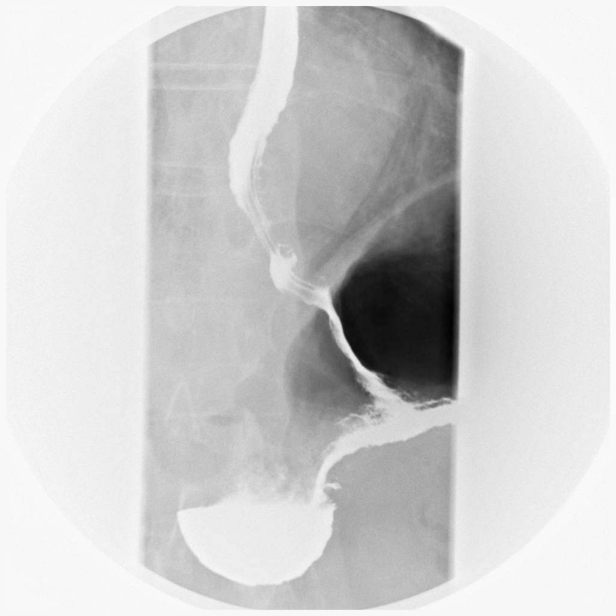

[Series 5: run · 1 of 8 slices shown (4 of 12)]
[im 8/8]
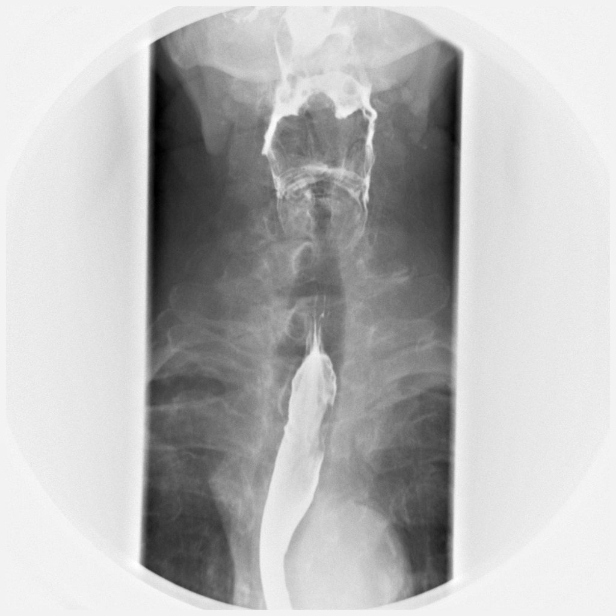

[Series 6: run · 2 of 11 slices shown (5 of 12)]
[im 1/11]
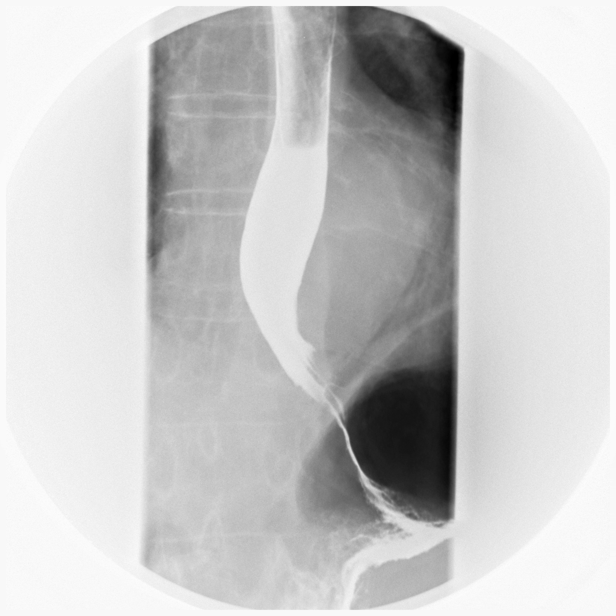
[im 11/11]
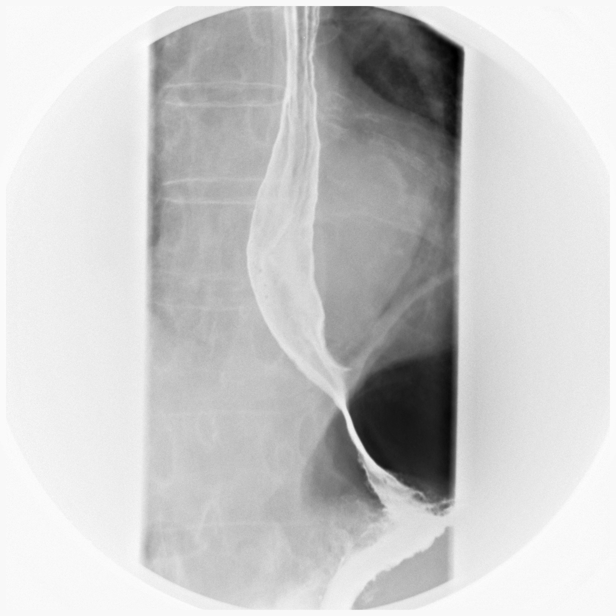

[Series 8: run · 2 of 8 slices shown (6 of 12)]
[im 1/8]
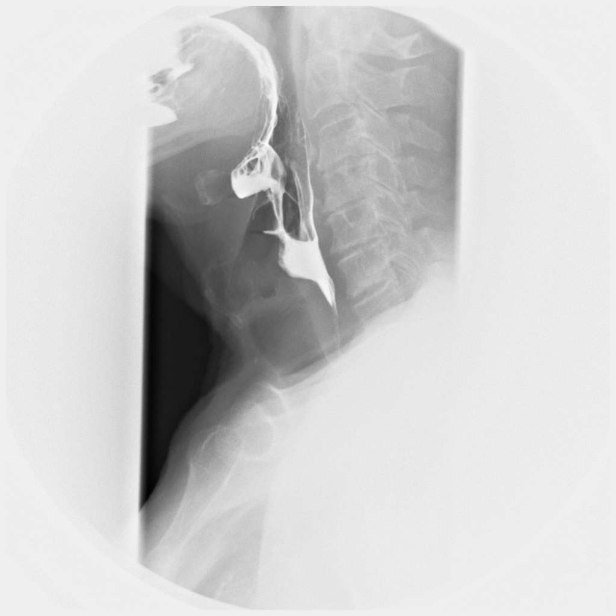
[im 8/8]
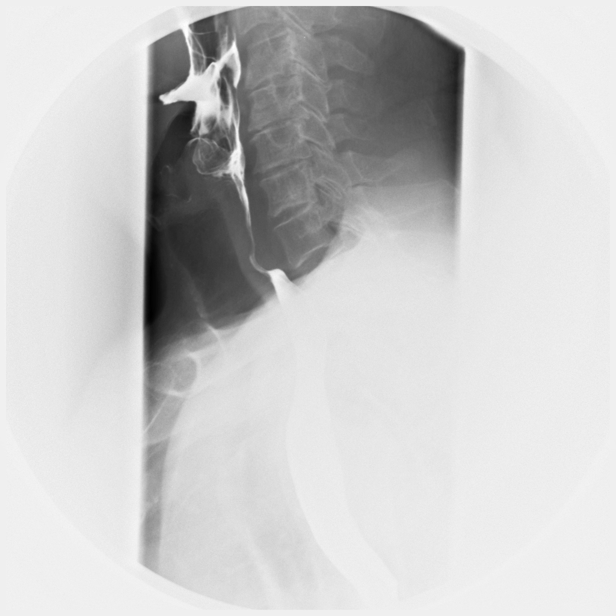

[Series 10: run · 1 of 2 slices shown (7 of 12)]
[im 1/2]
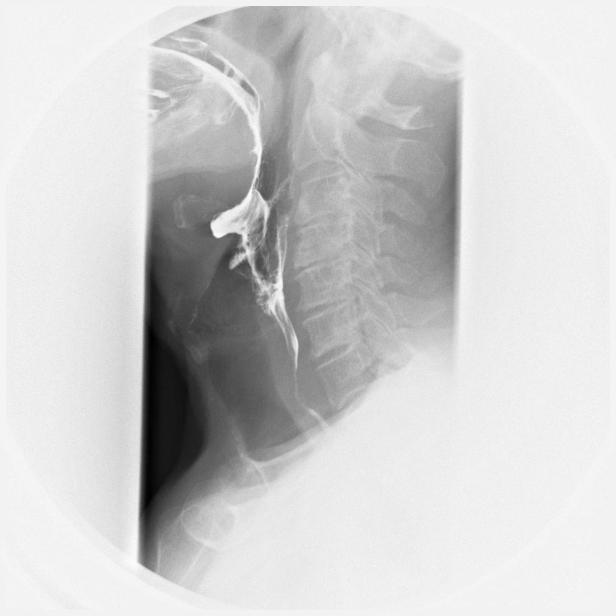

[Series 11: run · 1 of 1 slices shown (8 of 12)]
[im 1/1]
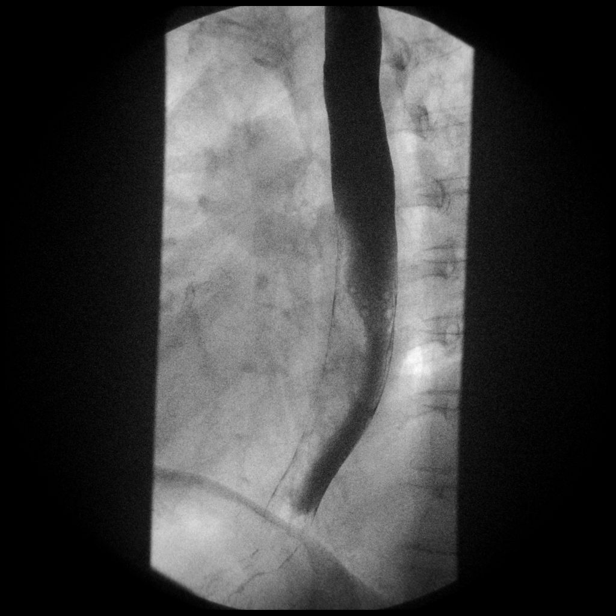

[Series 13: run · 1 of 1 slices shown (9 of 12)]
[im 1/1]
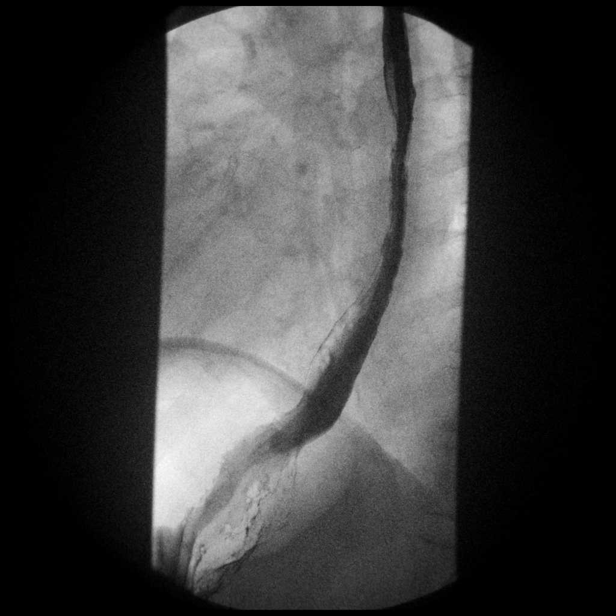

[Series 15: run · 1 of 1 slices shown (10 of 12)]
[im 1/1]
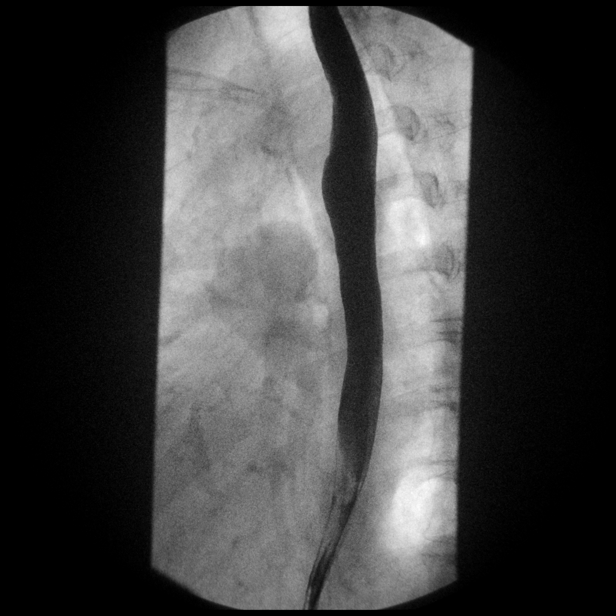

[Series 16: run · 1 of 1 slices shown (11 of 12)]
[im 1/1]
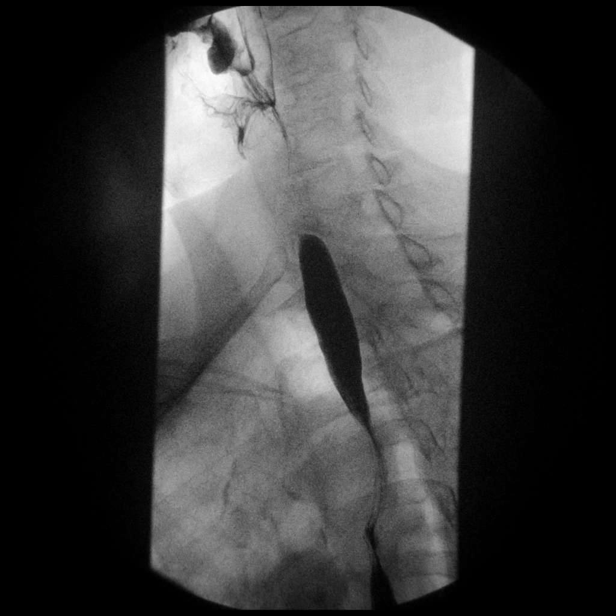

[Series 18: run · 1 of 1 slices shown (12 of 12)]
[im 1/1]
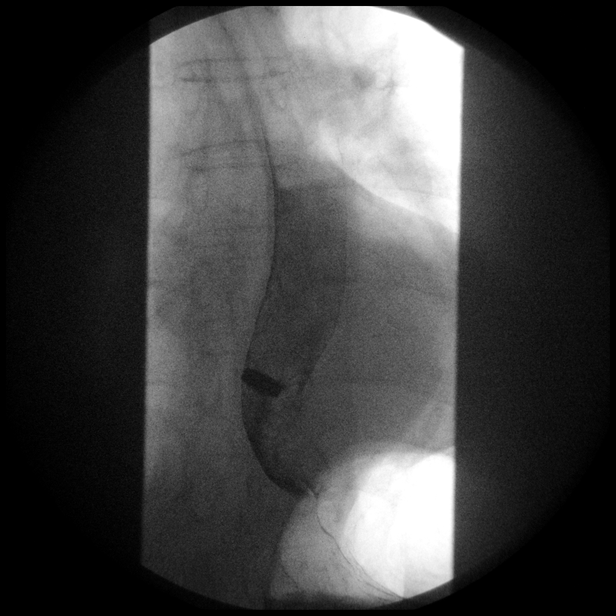

[15 of 24 positions shown; findings below may reference images not displayed]

FINDINGS: The mucosa and motility of the esophagus are normal.  No
hiatal hernia or stricture or mass.  Occasional tertiary
contractions in the distal two thirds of the esophagus.  The
valleculae and piriform sinuses are normal.  A 13 mm barium tablet
passed immediately from the mouth to the stomach with no delay.
There is no aspiration.
IMPRESSION: Occasional tertiary contractions in the distal two thirds of the
esophagus.  Otherwise normal.  Specifically, no evidence of a
stricture or mass or esophagitis.

## 2013-09-07 ENCOUNTER — Other Ambulatory Visit: Payer: Self-pay | Admitting: Cardiovascular Disease

## 2013-09-07 NOTE — Telephone Encounter (Signed)
Rx was sent to pharmacy electronically. 

## 2013-09-23 ENCOUNTER — Other Ambulatory Visit: Payer: Self-pay | Admitting: Cardiovascular Disease

## 2013-09-24 NOTE — Telephone Encounter (Signed)
Rx was sent to pharmacy electronically. 

## 2014-01-24 ENCOUNTER — Other Ambulatory Visit: Payer: Self-pay | Admitting: Cardiovascular Disease

## 2014-01-25 NOTE — Telephone Encounter (Signed)
Rx was sent to pharmacy electronically. 

## 2014-04-08 ENCOUNTER — Encounter: Payer: Self-pay | Admitting: Cardiovascular Disease

## 2014-04-08 ENCOUNTER — Ambulatory Visit (INDEPENDENT_AMBULATORY_CARE_PROVIDER_SITE_OTHER): Payer: Medicare Other | Admitting: Cardiovascular Disease

## 2014-04-08 VITALS — BP 132/74 | HR 54 | Resp 16 | Ht 65.0 in | Wt 139.7 lb

## 2014-04-08 DIAGNOSIS — I2510 Atherosclerotic heart disease of native coronary artery without angina pectoris: Secondary | ICD-10-CM

## 2014-04-08 DIAGNOSIS — E782 Mixed hyperlipidemia: Secondary | ICD-10-CM

## 2014-04-08 DIAGNOSIS — Z79899 Other long term (current) drug therapy: Secondary | ICD-10-CM

## 2014-04-08 MED ORDER — PANTOPRAZOLE SODIUM 40 MG PO TBEC: 40.0000 mg | DELAYED_RELEASE_TABLET | Freq: Every day | ORAL | Status: DC

## 2014-04-08 MED ORDER — ISOSORBIDE MONONITRATE ER 60 MG PO TB24: 60.0000 mg | ORAL_TABLET | Freq: Every day | ORAL | Status: DC

## 2014-04-08 NOTE — Patient Instructions (Signed)
Stop the Niacin.  Your physician recommends that you return for lab work in: 3 months.  Dr. Royann Shivers recommends that you schedule a follow-up appointment in: one year.

## 2014-04-10 ENCOUNTER — Encounter: Payer: Self-pay | Admitting: Cardiovascular Disease

## 2014-04-10 NOTE — Progress Notes (Signed)
Patient ID: Devon Solis, male   DOB: October 06, 1923, 80 y.o.   MRN: [id number]     Reason for office visit followup for coronary artery disease, hyperlipidemia   This is a yearly visit for Devon Solis. He is accompanied by several of his daughters. His only complaint is a waxing and waning pruritic faintly erythematous rash on his torso.  He does not have any chest pain or shortness of breath. He leads a fairly sedentary lifestyle.   He has an extensive history of coronary problems and over the years has received 3 stents in the right coronary artery and 2 in the left circumflex coronary artery all of them drug-eluting stents. His most recent cardiac catheterization that led to intervention was years ago in 2004. Followup coronary angiograms performed in 2007 and 2013 show that his previously placed stents were widely patent. On both occasions there is mention of a 80% stenosis in the bifurcation of a high first diagonal artery (which was felt to be less than 2 mm in diameter and therefore not amenable to percutaneous revascularization).    Allergies  Allergen Reactions  . Penicillins     Current Outpatient Prescriptions  Medication Sig Dispense Refill  . Ascorbic Acid (VITAMIN C) 1000 MG tablet Take 1,000 mg by mouth daily.      Marland Kitchen. aspirin 81 MG tablet Take 81 mg by mouth daily.      Marland Kitchen. atorvastatin (LIPITOR) 20 MG tablet TAKE 1 TABLET DAILY AT BEDTIME  90 tablet  3  . irbesartan (AVAPRO) 150 MG tablet TAKE 1 TABLET DAILY  90 tablet  2  . isosorbide mononitrate (IMDUR) 60 MG 24 hr tablet Take 1 tablet (60 mg total) by mouth daily.  90 tablet  3  . metoprolol tartrate (LOPRESSOR) 25 MG tablet Take 12.5 mg by mouth 2 (two) times daily.       . Multiple Vitamin (MULTIVITAMIN WITH MINERALS) TABS Take 1 tablet by mouth daily.      . nitroGLYCERIN (NITROSTAT) 0.4 MG SL tablet Place 0.4 mg under the tongue every 5 (five) minutes as needed.      . pantoprazole (PROTONIX) 40 MG tablet Take 1 tablet (40  mg total) by mouth daily.  90 tablet  3   No current facility-administered medications for this visit.    Past Medical History  Diagnosis Date  . CAD (coronary artery disease) 09/24/2012    extensive stents/PCI to RCA and LCX, last cath 09/2012- high moderate sized 1st diag. with 80% stenosis. no nucs since cath  . Dyslipidemia 09/24/2012  . GERD (gastroesophageal reflux disease) 09/24/2012  . HTN (hypertension) 09/24/2012  . Hiatal hernia   . Arthritis   . Atrial fibrillation   . CHF (congestive heart failure)   . DVT (deep venous thrombosis)   . History of gallstones   . Bleeding stomach ulcer   . Esophageal stricture   . Abdominal aortic aneurysm     small, last ulrasound 04/10/12,   . Mild aortic insufficiency     last echo 04/2012,  EF 50-55%, moderate MR, mild MVP    Past Surgical History  Procedure Laterality Date  . Coronary angioplasty  05/2003    3 cypher stents placed urgently for inf. MI to -RCA, 2 cypher stents -OM1 & 1 cypher stent to LCX  . Cholecystectomy    . Esophagogastroduodenoscopy (egd) with esophageal dilation  09/26/2012    Procedure: ESOPHAGOGASTRODUODENOSCOPY (EGD) WITH ESOPHAGEAL DILATION;  Surgeon: Beverley FiedlerJay M Pyrtle, MD;  Location: [facility] ENDOSCOPY;  Service: Gastroenterology;  Laterality: N/A;  . Appendectomy    . Nasal sinus surgery    . Cardiac catheterization  02/10/06    Patent stents residual LAD disease  . Cardiac catheterization  09/24/2012    patent stents, 80% 1st diag branch though not felt responsible for symptoms.    Family History  Problem Relation Age of Onset  . Colon polyps Daughter     x 2  . Diabetes Father   . Heart disease Father     died at 20  . Diabetes Daughter   . Diabetes Brother   . Liver disease Daughter   . Aneurysm Daughter     AAA  . Cancer Mother     died at 42  . Breast cancer Sister     History   Social History  . Marital Status: Widowed    Spouse Name: N/A    Number of Children: 4  . Years of Education:  N/A   Occupational History  . retired    Social History Main Topics  . Smoking status: Former Smoker    Types: Cigarettes    Quit date: 08/18/1965  . Smokeless tobacco: Never Used  . Alcohol Use: No  . Drug Use: No  . Sexual Activity: Not on file   Other Topics Concern  . Not on file   Social History Narrative  . No narrative on file    Review of systems: The patient specifically denies any chest pain at rest or with exertion, dyspnea at rest or with exertion, orthopnea, paroxysmal nocturnal dyspnea, syncope, palpitations, focal neurological deficits, intermittent claudication, lower extremity edema, unexplained weight gain, cough, hemoptysis or wheezing.  The patient also denies abdominal pain, nausea, vomiting, dysphagia, diarrhea, constipation, polyuria, polydipsia, dysuria, hematuria, frequency, urgency, abnormal bleeding or bruising, fever, chills, unexpected weight changes, mood swings, change in skin or hair texture, change in voice quality, auditory or visual problems, new musculoskeletal complaints other than usual "aches and pains".   PHYSICAL EXAM BP 132/74  Pulse 54  Resp 16  Ht 5\' 5"  (1.651 m)  Wt 139 lb 11.2 oz (63.368 kg)  BMI 23.25 kg/m2 General: Alert, oriented x3, no distress  Head: no evidence of trauma, PERRL, EOMI, no exophtalmos or lid lag, no myxedema, no xanthelasma; normal ears, nose and oropharynx  Neck: normal jugular venous pulsations and no hepatojugular reflux; brisk carotid pulses without delay and no carotid bruits  Chest: clear to auscultation, no signs of consolidation by percussion or palpation, normal fremitus, symmetrical and full respiratory excursions  Cardiovascular: normal position and quality of the apical impulse, regular rhythm, normal first and second heart sounds, rate 2/6 early peaking systolic ejection murmur in the aortic focus, grade 1/6 whole systolic apical murmur, rubs or gallops  Abdomen: no tenderness or distention, no  masses by palpation, no abnormal pulsatility or arterial bruits, normal bowel sounds, no hepatosplenomegaly  Extremities: no clubbing, cyanosis or edema; 2+ radial, ulnar and brachial pulses bilaterally; 2+ right femoral, posterior tibial and dorsalis pedis pulses; 2+ left femoral, posterior tibial and dorsalis pedis pulses; no subclavian or femoral bruits  Neurological: grossly nonfocal   EKG: Sinus bradycardia, left axis deviation, unchanged  Lipid Panel  No results found for this basename: chol, trig, hdl, cholhdl, vldl, ldlcalc    BMET    Component Value Date/Time   NA 138 09/26/2012 0445   K 3.7 09/26/2012 0445   CL 104 09/26/2012 0445   CO2 25 09/26/2012 0445   GLUCOSE 95 09/26/2012  0445   BUN 13 09/26/2012 0445   CREATININE 0.91 09/26/2012 0445   CALCIUM 8.8 09/26/2012 0445   GFRNONAA 74* 09/26/2012 0445   GFRAA 86* 09/26/2012 0445     ASSESSMENT AND PLAN No problem-specific assessment & plan notes found for this encounter. CAD (coronary artery disease), 3 cypher stents to RCA after MI, 2 cypher stents to LCX 2004 stable with cath 09/24/12  Currently asymptomatic. Inferoseptal scar without ischemia by myocardial perfusion study 2011. No need for percutaneous revascularization since 2004. Most recent angiography 2011.  Dyslipidemia  Not sure if his niacin is contributing to the rash/pruritus. Will stop it and recheck lipids in 2-3 months GERD (gastroesophageal reflux disease)  History of severe GI bleeding in the past   AAA (abdominal aortic aneurysm)  Not confirmed by most recent studies. The maximum aortic diameter was 2.8 cm. Routine follow up does not appear indicated Mitral regurgitation Prolapse of the anterior mitral leaflet with moderate mitral insufficiency. No clinical signs or symptoms of CHF.   Orders Placed This Encounter  Procedures  . Lipid panel  . Comprehensive metabolic panel  . EKG 12-Lead   Meds ordered this encounter  Medications  . aspirin  81 MG tablet    Sig: Take 81 mg by mouth daily.  . pantoprazole (PROTONIX) 40 MG tablet    Sig: Take 1 tablet (40 mg total) by mouth daily.    Dispense:  90 tablet    Refill:  3  . isosorbide mononitrate (IMDUR) 60 MG 24 hr tablet    Sig: Take 1 tablet (60 mg total) by mouth daily.    Dispense:  90 tablet    Refill:  3    Cairo Lingenfelter  Thurmon Fair, MD, [facility] [phone] office [phone] pager

## 2014-07-05 ENCOUNTER — Other Ambulatory Visit: Payer: Self-pay | Admitting: Cardiovascular Disease

## 2014-07-05 ENCOUNTER — Encounter: Payer: Self-pay | Admitting: Cardiovascular Disease

## 2014-07-05 NOTE — Telephone Encounter (Signed)
Rx refill sent to patient pharmacy   

## 2014-07-25 ENCOUNTER — Other Ambulatory Visit: Payer: Self-pay | Admitting: Cardiovascular Disease

## 2014-07-26 NOTE — Telephone Encounter (Signed)
Rx was sent to pharmacy electronically. 

## 2014-10-10 ENCOUNTER — Encounter (HOSPITAL_COMMUNITY): Payer: Self-pay | Admitting: Cardiovascular Disease

## 2015-03-03 ENCOUNTER — Encounter: Payer: Self-pay | Admitting: *Deleted

## 2015-03-10 ENCOUNTER — Other Ambulatory Visit: Payer: Self-pay | Admitting: *Deleted

## 2015-03-10 MED ORDER — PANTOPRAZOLE SODIUM 40 MG PO TBEC: 40.0000 mg | DELAYED_RELEASE_TABLET | Freq: Every day | ORAL | Status: DC

## 2015-03-10 MED ORDER — ATORVASTATIN CALCIUM 20 MG PO TABS: 20.0000 mg | ORAL_TABLET | Freq: Every day | ORAL | Status: DC

## 2015-03-10 MED ORDER — IRBESARTAN 150 MG PO TABS: 150.0000 mg | ORAL_TABLET | Freq: Every day | ORAL | Status: DC

## 2015-03-27 ENCOUNTER — Encounter: Payer: Self-pay | Admitting: Cardiovascular Disease

## 2015-04-16 ENCOUNTER — Encounter: Payer: Self-pay | Admitting: Cardiovascular Disease

## 2015-04-16 ENCOUNTER — Ambulatory Visit (INDEPENDENT_AMBULATORY_CARE_PROVIDER_SITE_OTHER): Payer: Medicare Other | Admitting: Cardiovascular Disease

## 2015-04-16 VITALS — BP 132/74 | HR 61 | Ht 66.0 in | Wt 138.0 lb

## 2015-04-16 DIAGNOSIS — I2510 Atherosclerotic heart disease of native coronary artery without angina pectoris: Secondary | ICD-10-CM | POA: Diagnosis not present

## 2015-04-16 DIAGNOSIS — I714 Abdominal aortic aneurysm, without rupture, unspecified: Secondary | ICD-10-CM

## 2015-04-16 DIAGNOSIS — I10 Essential (primary) hypertension: Secondary | ICD-10-CM | POA: Diagnosis not present

## 2015-04-16 DIAGNOSIS — Z79899 Other long term (current) drug therapy: Secondary | ICD-10-CM

## 2015-04-16 DIAGNOSIS — I493 Ventricular premature depolarization: Secondary | ICD-10-CM

## 2015-04-16 DIAGNOSIS — E785 Hyperlipidemia, unspecified: Secondary | ICD-10-CM | POA: Diagnosis not present

## 2015-04-16 MED ORDER — PANTOPRAZOLE SODIUM 40 MG PO TBEC: 40.0000 mg | DELAYED_RELEASE_TABLET | Freq: Every day | ORAL | Status: DC

## 2015-04-16 MED ORDER — ATORVASTATIN CALCIUM 20 MG PO TABS: 20.0000 mg | ORAL_TABLET | Freq: Every day | ORAL | Status: DC

## 2015-04-16 MED ORDER — IRBESARTAN 150 MG PO TABS: 150.0000 mg | ORAL_TABLET | Freq: Every day | ORAL | Status: DC

## 2015-04-16 NOTE — Patient Instructions (Signed)
Medication Instructions:   None  Labwork:  FASTING at [facility]  Testing/Procedures:  Abdominal Ultrasound in one year prior to your next visit.  Follow-Up:  One year.

## 2015-04-16 NOTE — Progress Notes (Signed)
Patient ID: Devon Solis, male   DOB: 10-11-1923, 82 y.o.   MRN: [id number]     Cardiology Office Note   Date:  04/17/2015   ID:  Devon Solis, DOB 10-Sep-1923, MRN [id number]  PCP:  Arma Heading, MD  Cardiologist:   Thurmon Fair, MD   Chief Complaint  Patient presents with  . Annual Exam    Patient feels his heart switch sometimes at night.      History of Present Illness: Devon Solis is a 81 y.o. male who presents for follow-up for coronary artery disease, hyperlipidemia, hypertension and a small abdominal aortic aneurysm. He feels great. His only complaint is that he hears his heart "switching" at night which I guess is his way of describing palpitations. His electrocardiogram today shows one PAC and one PVC. He denies any issues with angina or dyspnea either at rest or with exertion and has not been troubled by dizziness, syncope, sustained palpitations, lower extremity edema, abdominal pain or focal neurological deficits.  He has an extensive history of coronary problems and over the years has received 3 stents in the right coronary artery and 2 in the left circumflex coronary artery all of them drug-eluting stents. His most recent cardiac catheterization that led to intervention was years ago in 2004. Followup coronary angiograms performed in 2007 and 2013 show that his previously placed stents were widely patent. On both occasions there is mention of a 80% stenosis in the bifurcation of a high first diagonal artery (which was felt to be less than 2 mm in diameter and therefore not amenable to percutaneous revascularization). Previously reported have a small abdominal aortic aneurysm, this was not described on a follow-up study in 2013.  Past Medical History  Diagnosis Date  . CAD (coronary artery disease) 09/24/2012    extensive stents/PCI to RCA and LCX, last cath 09/2012- high moderate sized 1st diag. with 80% stenosis. no nucs since cath  . Dyslipidemia 09/24/2012  . GERD  (gastroesophageal reflux disease) 09/24/2012  . HTN (hypertension) 09/24/2012  . Hiatal hernia   . Arthritis   . Atrial fibrillation   . CHF (congestive heart failure)   . DVT (deep venous thrombosis)   . History of gallstones   . Bleeding stomach ulcer   . Esophageal stricture   . Abdominal aortic aneurysm     small, last ulrasound 04/10/12,   . Mild aortic insufficiency     last echo 04/2012,  EF 50-55%, moderate MR, mild MVP  . PVCs (premature ventricular contractions) 04/17/2015    Past Surgical History  Procedure Laterality Date  . Coronary angioplasty  05/2003    3 cypher stents placed urgently for inf. MI to -RCA, 2 cypher stents -OM1 & 1 cypher stent to LCX  . Cholecystectomy    . Esophagogastroduodenoscopy (egd) with esophageal dilation  09/26/2012    Procedure: ESOPHAGOGASTRODUODENOSCOPY (EGD) WITH ESOPHAGEAL DILATION;  Surgeon: Beverley Fiedler, MD;  Location: [facility] ENDOSCOPY;  Service: Gastroenterology;  Laterality: N/A;  . Appendectomy    . Nasal sinus surgery    . Cardiac catheterization  02/10/06    Patent stents residual LAD disease  . Cardiac catheterization  09/24/2012    patent stents, 80% 1st diag branch though not felt responsible for symptoms.  . Left heart cath N/A 09/24/2012    Procedure: LEFT HEART CATH;  Surgeon: Runell Gess, MD;  Location: [facility] CATH LAB;  Service: Cardiovascular;  Laterality: N/A;     Current Outpatient Prescriptions  Medication Sig Dispense Refill  .  Ascorbic Acid (VITAMIN C) 1000 MG tablet Take 1,000 mg by mouth daily.    Marland Kitchen aspirin 81 MG tablet Take 81 mg by mouth daily.    Marland Kitchen atorvastatin (LIPITOR) 20 MG tablet Take 1 tablet (20 mg total) by mouth at bedtime. Need appointment before anymore refills 90 tablet 3  . irbesartan (AVAPRO) 150 MG tablet Take 1 tablet (150 mg total) by mouth daily. Need appointment before anymore refills 90 tablet 3  . isosorbide mononitrate (IMDUR) 60 MG 24 hr tablet Take 1 tablet (60 mg total) by mouth daily. 90  tablet 3  . metoprolol tartrate (LOPRESSOR) 25 MG tablet Take 12.5 mg by mouth 2 (two) times daily.     . Multiple Vitamin (MULTIVITAMIN WITH MINERALS) TABS Take 1 tablet by mouth daily.    . nitroGLYCERIN (NITROSTAT) 0.4 MG SL tablet Place 0.4 mg under the tongue every 5 (five) minutes as needed.    . pantoprazole (PROTONIX) 40 MG tablet Take 1 tablet (40 mg total) by mouth daily. Need appointment before anymore refills 90 tablet 3   No current facility-administered medications for this visit.    Allergies:   Penicillins    Social History:  The patient  reports that he quit smoking about 49 years ago. His smoking use included Cigarettes. He has never used smokeless tobacco. He reports that he does not drink alcohol or use illicit drugs.   Family History:  The patient's family history includes Aneurysm in his daughter; Breast cancer in his sister; Cancer in his mother; Colon polyps in his daughter; Diabetes in his brother, daughter, and father; Heart disease in his father; Liver disease in his daughter.    ROS:  Please see the history of present illness.    Otherwise, review of systems positive for none.   All other systems are reviewed and negative.    PHYSICAL EXAM: VS:  BP 132/74 mmHg  Pulse 61  Ht  (1.676 m)  Wt 138 lb (62.596 kg)  BMI 22.28 kg/m2 , BMI Body mass index is 22.28 kg/(m^2).  General: Alert, oriented x3, no distress Head: no evidence of trauma, PERRL, EOMI, no exophtalmos or lid lag, no myxedema, no xanthelasma; normal ears, nose and oropharynx Neck: normal jugular venous pulsations and no hepatojugular reflux; brisk carotid pulses without delay and no carotid bruits Chest: clear to auscultation, no signs of consolidation by percussion or palpation, normal fremitus, symmetrical and full respiratory excursions Cardiovascular: normal position and quality of the apical impulse, regular rhythm with occasional ectopy, normal first and second heart sounds, no murmurs,  rubs or gallops Abdomen: no tenderness or distention, no masses by palpation, no abnormal pulsatility or arterial bruits, normal bowel sounds, no hepatosplenomegaly Extremities: no clubbing, cyanosis or edema; 2+ radial, ulnar and brachial pulses bilaterally; 2+ right femoral, posterior tibial and dorsalis pedis pulses; 2+ left femoral, posterior tibial and dorsalis pedis pulses; no subclavian or femoral bruits Neurological: grossly nonfocal Psych: euthymic mood, full affect   EKG:  EKG is ordered today. The ekg ordered today demonstrates on his rhythm with 1 PVC and one PVC, no repolarization abnormalities, QTC 436 ms   Recent Labs: No results found for requested labs within last 365 days.    Lipid Panel No results found for: CHOL, TRIG, HDL, CHOLHDL, VLDL, LDLCALC, LDLDIRECT    Wt Readings from Last 3 Encounters:  04/16/15 138 lb (62.596 kg)  04/08/14 139 lb 11.2 oz (63.368 kg)  04/03/13 136 lb (61.689 kg)  ASSESSMENT AND PLAN:  HTN (hypertension) Good control  CAD (coronary artery disease), 3 cypher stents to RCA after MI, 2 cypher stents to LCX 2004 stable with cath 09/24/12  Currently asymptomatic. Inferoseptal scar without ischemia by myocardial perfusion study 2011. No need for percutaneous revascularization since 2004. Most recent angiography 2011.  Dyslipidemia On statin. He is due to have a lipid profile checked.  GERD (gastroesophageal reflux disease) History of severe GI bleeding in the past, no problems in several years  AAA (abdominal aortic aneurysm) Previously reported by ultrasound, his most recent study in June of 2013 does not describe any aneurysm. The maximum aortic diameter was 2.8 cm. He expresses concern and would like a repeat study to clarify the situation   Current medicines are reviewed at length with the patient today.  The patient does not have concerns regarding medicines.  The following changes have been made:  no change  Labs/ tests  ordered today include:  Orders Placed This Encounter  Procedures  . Comprehensive metabolic panel  . Lipid panel  . EKG 12-Lead    Patient Instructions  Medication Instructions:   None  Labwork:  FASTING at [facility]  Testing/Procedures:  Abdominal Ultrasound in one year prior to your next visit.  Follow-Up:  One year.        Joie Bimler, MD  04/17/2015 12:00 PM    Thurmon Fair, MD, [facility] [phone] office [phone] pager

## 2015-04-17 ENCOUNTER — Encounter: Payer: Self-pay | Admitting: Cardiovascular Disease

## 2015-04-17 DIAGNOSIS — I493 Ventricular premature depolarization: Secondary | ICD-10-CM

## 2015-04-17 HISTORY — DX: Ventricular premature depolarization: I49.3

## 2015-04-24 ENCOUNTER — Encounter: Payer: Self-pay | Admitting: Cardiovascular Disease

## 2015-04-28 ENCOUNTER — Other Ambulatory Visit: Payer: Self-pay | Admitting: *Deleted

## 2015-04-28 ENCOUNTER — Encounter: Payer: Self-pay | Admitting: Cardiovascular Disease

## 2015-04-28 MED ORDER — ISOSORBIDE MONONITRATE ER 60 MG PO TB24: 60.0000 mg | ORAL_TABLET | Freq: Every day | ORAL | Status: DC

## 2015-04-28 NOTE — Telephone Encounter (Signed)
Rx(s) sent to pharmacy electronically.  

## 2015-06-08 ENCOUNTER — Other Ambulatory Visit: Payer: Self-pay | Admitting: Cardiovascular Disease

## 2015-06-09 NOTE — Telephone Encounter (Signed)
Rx(s) sent to pharmacy electronically.  

## 2016-03-10 ENCOUNTER — Ambulatory Visit (HOSPITAL_COMMUNITY)
Admission: RE | Admit: 2016-03-10 | Discharge: 2016-03-10 | Disposition: A | Discharge status: Home / Self Care | Payer: Medicare Other | Source: Ambulatory Visit | Attending: Cardiovascular Disease | Admitting: Cardiovascular Disease

## 2016-03-10 DIAGNOSIS — E785 Hyperlipidemia, unspecified: Secondary | ICD-10-CM | POA: Insufficient documentation

## 2016-03-10 DIAGNOSIS — I509 Heart failure, unspecified: Secondary | ICD-10-CM | POA: Insufficient documentation

## 2016-03-10 DIAGNOSIS — I2510 Atherosclerotic heart disease of native coronary artery without angina pectoris: Secondary | ICD-10-CM | POA: Insufficient documentation

## 2016-03-10 DIAGNOSIS — I700 Atherosclerosis of aorta: Secondary | ICD-10-CM | POA: Insufficient documentation

## 2016-03-10 DIAGNOSIS — I714 Abdominal aortic aneurysm, without rupture, unspecified: Secondary | ICD-10-CM

## 2016-03-10 DIAGNOSIS — I110 Hypertensive heart disease with heart failure: Secondary | ICD-10-CM | POA: Diagnosis not present

## 2016-03-10 DIAGNOSIS — K219 Gastro-esophageal reflux disease without esophagitis: Secondary | ICD-10-CM | POA: Insufficient documentation

## 2016-03-10 DIAGNOSIS — I708 Atherosclerosis of other arteries: Secondary | ICD-10-CM | POA: Diagnosis not present

## 2016-04-11 ENCOUNTER — Other Ambulatory Visit: Payer: Self-pay | Admitting: Cardiovascular Disease

## 2016-04-12 NOTE — Telephone Encounter (Signed)
Rx request sent to pharmacy.  

## 2016-04-13 ENCOUNTER — Telehealth: Payer: Self-pay | Admitting: Cardiovascular Disease

## 2016-04-15 ENCOUNTER — Ambulatory Visit: Payer: Medicare Other | Admitting: Cardiovascular Disease

## 2016-04-15 NOTE — Telephone Encounter (Signed)
Closed encounter °

## 2016-05-24 ENCOUNTER — Other Ambulatory Visit: Payer: Self-pay | Admitting: Cardiovascular Disease

## 2016-06-03 ENCOUNTER — Other Ambulatory Visit: Payer: Self-pay | Admitting: Cardiovascular Disease

## 2016-06-03 NOTE — Telephone Encounter (Signed)
New message        *STAT* If patient is at the pharmacy, call can be transferred to refill team.   1. Which medications need to be refilled? (please list name of each medication and dose if known) Irbesartan 150 mg po daily  2. Which pharmacy/location (including street and city if local pharmacy) is medication to be sent to?[facility] in [place]  3. Do they need a 30 day or 90 day supply?90 days

## 2016-06-14 ENCOUNTER — Encounter: Payer: Self-pay | Admitting: Cardiovascular Disease

## 2016-06-14 ENCOUNTER — Ambulatory Visit (INDEPENDENT_AMBULATORY_CARE_PROVIDER_SITE_OTHER): Payer: Medicare Other | Admitting: Cardiovascular Disease

## 2016-06-14 VITALS — BP 134/70 | HR 64 | Ht 67.0 in | Wt 138.0 lb

## 2016-06-14 DIAGNOSIS — R059 Cough, unspecified: Secondary | ICD-10-CM

## 2016-06-14 DIAGNOSIS — I2510 Atherosclerotic heart disease of native coronary artery without angina pectoris: Secondary | ICD-10-CM

## 2016-06-14 DIAGNOSIS — Z79899 Other long term (current) drug therapy: Secondary | ICD-10-CM | POA: Diagnosis not present

## 2016-06-14 DIAGNOSIS — E785 Hyperlipidemia, unspecified: Secondary | ICD-10-CM

## 2016-06-14 DIAGNOSIS — I340 Nonrheumatic mitral (valve) insufficiency: Secondary | ICD-10-CM

## 2016-06-14 DIAGNOSIS — R05 Cough: Secondary | ICD-10-CM | POA: Diagnosis not present

## 2016-06-14 DIAGNOSIS — I10 Essential (primary) hypertension: Secondary | ICD-10-CM

## 2016-06-14 MED ORDER — BENZONATATE 100 MG PO CAPS: 100.0000 mg | ORAL_CAPSULE | Freq: Three times a day (TID) | ORAL | 0 refills | Status: DC | PRN

## 2016-06-14 MED ORDER — PANTOPRAZOLE SODIUM 40 MG PO TBEC: 40.0000 mg | DELAYED_RELEASE_TABLET | Freq: Every day | ORAL | 3 refills | Status: DC

## 2016-06-14 MED ORDER — IRBESARTAN 150 MG PO TABS: 150.0000 mg | ORAL_TABLET | Freq: Every day | ORAL | 3 refills | Status: DC

## 2016-06-14 MED ORDER — ATORVASTATIN CALCIUM 20 MG PO TABS: 20.0000 mg | ORAL_TABLET | Freq: Every day | ORAL | 3 refills | Status: DC

## 2016-06-14 NOTE — Patient Instructions (Signed)
Dr Royann Shiversroitoru has recommended making the following medication changes: 1. TAKE Tessalon 100 mg - take 1 capsule by mouth every 8 hours as needed for cough  Your physician recommends that you return for lab work at your convenience - FASTING.  Dr Royann Shiversroitoru recommends that you schedule a follow-up appointment in 12 months. You will receive a reminder letter in the mail two months in advance. If you don't receive a letter, please call our office to schedule the follow-up appointment.  If you need a refill on your cardiac medications before your next appointment, please call your pharmacy.

## 2016-06-14 NOTE — Progress Notes (Signed)
Cardiology Office Note    Date:  06/15/2016   ID:  Devon Solis, DOB [birth year], MRN [id number]  PCP:  Arma HeadingISERNIA,JAMES, MD  Cardiologist:   Thurmon FairMihai Kamia Insalaco, MD   chief complaint: cough   History of Present Illness:  Devon Solis is a 82 y.o. male with extensive history of coronary artery disease, not requiring revascularization since 2004, hyperlipidemia, hypertension, mitral valve prolapse with moderate mitral insufficiency, mild to moderate aortic insufficiency returning for routine follow-up.  He denies angina and dyspnea either at rest with exertion. He had a respiratory infection about a month ago and has a unrelenting dry cough ever since. It keeps him up at night. He denies orthopnea or PND and has not had leg edema. He does not have fever or chills and the cough is nonproductive. He denies claudication or focal neurological complaints.  Abdominal ultrasound again shows no true abdominal aortic aneurysm, maximum aortic diameter 2.8 mm.  He has an extensive history of coronary problems and over the years has received 3 stents in the right coronary artery and 2 in the left circumflex coronary artery all of them drug-eluting stents. His most recent cardiac catheterization that led to intervention was years ago in 2004. Followup coronary angiograms performed in 2007 and 2013 show that his previously placed stents were widely patent. On both occasions there is mention of a 80% stenosis in the bifurcation of a high first diagonal artery (which was felt to be less than 2 mm in diameter and therefore not amenable to percutaneous revascularization). Previously reported have a small abdominal aortic aneurysm, this was not described on a follow-up study in 2013 or on the study from May 2017.   Past Medical History:  Diagnosis Date  . Abdominal aortic aneurysm (HCC)    small, last ulrasound 04/10/12,   . Arthritis   . Atrial fibrillation (HCC)   . Bleeding stomach ulcer   . CAD (coronary artery  disease) 09/24/2012   extensive stents/PCI to RCA and LCX, last cath 09/2012- high moderate sized 1st diag. with 80% stenosis. no nucs since cath  . CHF (congestive heart failure) (HCC)   . DVT (deep venous thrombosis) (HCC)   . Dyslipidemia 09/24/2012  . Esophageal stricture   . GERD (gastroesophageal reflux disease) 09/24/2012  . Hiatal hernia   . History of gallstones   . HTN (hypertension) 09/24/2012  . Mild aortic insufficiency    last echo 04/2012,  EF 50-55%, moderate MR, mild MVP  . PVCs (premature ventricular contractions) 04/17/2015    Past Surgical History:  Procedure Laterality Date  . APPENDECTOMY    . CARDIAC CATHETERIZATION  02/10/06   Patent stents residual LAD disease  . CARDIAC CATHETERIZATION  09/24/2012   patent stents, 80% 1st diag branch though not felt responsible for symptoms.  . CHOLECYSTECTOMY    . CORONARY ANGIOPLASTY  05/2003   3 cypher stents placed urgently for inf. MI to -RCA, 2 cypher stents -OM1 & 1 cypher stent to LCX  . ESOPHAGOGASTRODUODENOSCOPY (EGD) WITH ESOPHAGEAL DILATION  09/26/2012   Procedure: ESOPHAGOGASTRODUODENOSCOPY (EGD) WITH ESOPHAGEAL DILATION;  Surgeon: Beverley FiedlerJay M Pyrtle, MD;  [facility];  Service: Gastroenterology;  Laterality: N/A;  . LEFT HEART CATH N/A 09/24/2012   Procedure: LEFT HEART CATH;  Surgeon: Runell GessJonathan J Berry, MD;  [facility];  Service: Cardiovascular;  Laterality: N/A;  . NASAL SINUS SURGERY      Current Medications: Outpatient Medications Prior to Visit  Medication Sig Dispense Refill  . Ascorbic Acid (  VITAMIN C) 1000 MG tablet Take 1,000 mg by mouth daily.    Marland Kitchen. aspirin 81 MG tablet Take 81 mg by mouth daily.    . isosorbide mononitrate (IMDUR) 60 MG 24 hr tablet TAKE 1 TABLET(60 MG) BY MOUTH DAILY 90 tablet 1  . metoprolol tartrate (LOPRESSOR) 25 MG tablet Take 12.5 mg by mouth 2 (two) times daily.     . Multiple Vitamin (MULTIVITAMIN WITH MINERALS) TABS Take 1 tablet by mouth daily.    .  nitroGLYCERIN (NITROSTAT) 0.4 MG SL tablet Place 0.4 mg under the tongue every 5 (five) minutes as needed.    Marland Kitchen. atorvastatin (LIPITOR) 20 MG tablet TAKE 1 TABLET BY MOUTH AT BEDTIME( NEED APPT) 90 tablet 3  . atorvastatin (LIPITOR) 20 MG tablet TAKE 1 TABLET(20 MG) BY MOUTH AT BEDTIME 90 tablet 0  . irbesartan (AVAPRO) 150 MG tablet TAKE 1 TABLET BY MOUTH EVERY DAY. PT NEEDS APPOINTMENT BEFORE ANY MORE REFILLS 90 tablet 3  . irbesartan (AVAPRO) 150 MG tablet TAKE 1 TABLET(150 MG) BY MOUTH DAILY 30 tablet 0  . pantoprazole (PROTONIX) 40 MG tablet Take 1 tablet (40 mg total) by mouth daily. Need appointment before anymore refills 90 tablet 3  . pantoprazole (PROTONIX) 40 MG tablet TAKE 1 TABLET BY MOUTH EVERY DAY. PT NEEDS APPOINTMENT BEFORE ANYMORE REFILLS 90 tablet 3   No facility-administered medications prior to visit.      Allergies:   Penicillins   Social History   Social History  . Marital status: Widowed    Spouse name: N/A  . Number of children: 4  . Years of education: N/A   Occupational History  . retired    Social History Main Topics  . Smoking status: Former Smoker    Types: Cigarettes    Quit date: 08/18/1965  . Smokeless tobacco: Never Used  . Alcohol use No  . Drug use: No  . Sexual activity: Not Asked   Other Topics Concern  . None   Social History Narrative  . None     Family History:  The patient's family history includes Aneurysm in his daughter; Breast cancer in his sister; Cancer in his mother; Colon polyps in his daughter; Diabetes in his brother, daughter, and father; Heart disease in his father; Liver disease in his daughter.   ROS:   Please see the history of present illness.    ROS All other systems reviewed and are negative.   PHYSICAL EXAM:   VS:  BP 134/70   Pulse 64   Ht 5\' 7"  (1.702 m)   Wt 138 lb (62.6 kg)   BMI 21.61 kg/m    GEN: Well nourished, well developed, in no acute distress  HEENT: normal  Neck: no JVD, carotid bruits, or  masses Cardiac: RRR; no murmurs, rubs, or gallops,no edema , late systolic apical murmur Respiratory:  clear to auscultation bilaterally, normal work of breathing GI: soft, nontender, nondistended, + BS MS: no deformity or atrophy  Skin: warm and dry, no rash Neuro:  Alert and Oriented x 3, Strength and sensation are intact Psych: euthymic mood, full affect  Wt Readings from Last 3 Encounters:  06/14/16 138 lb (62.6 kg)  04/16/15 138 lb (62.6 kg)  04/08/14 139 lb 11.2 oz (63.4 kg)      Studies/Labs Reviewed:   EKG:  EKG is ordered today.  The ekg ordered today demonstrates Normal sinus rhythm, left axis deviation, otherwise normal tracing. QTC 429 ms  Labs performed by primary care physician,  not currently available for review, reportedly "okay".  ASSESSMENT:    1. Coronary artery disease involving native coronary artery of native heart without angina pectoris   2. Dyslipidemia   3. Essential hypertension   4. Mitral insufficiency   5. Cough   6. Medication management      PLAN:  In order of problems listed above:  1. CAD: Asymptomatic, no need for revascularization in the last 13 years. Inferoseptal scar without ischemia by myocardial perfusion study 2011. No need for percutaneous revascularization since 2004. Most recent angiography 2013. 2. HTN: Well controlled. reported history of AAA not confirmed, no need for periodic follow-up 3. HLP: Get labs from proper care provider. On statin. 4. MVP/MR: Asymptomatic, not yet hemodynamically relevant. The murmur of aortic insufficiency is not audible and is likewise unlikely to be hemodynamically relevant. 5. Cough, sounds like post viral syndrome. Give him a short supply of Tessalon Perles.    Medication Adjustments/Labs and Tests Ordered: Current medicines are reviewed at length with the patient today.  Concerns regarding medicines are outlined above.  Medication changes, Labs and Tests ordered today are listed in the Patient  Instructions below. Patient Instructions  Dr Royann Shivers has recommended making the following medication changes: 1. TAKE Tessalon 100 mg - take 1 capsule by mouth every 8 hours as needed for cough  Your physician recommends that you return for lab work at your convenience - FASTING.  Dr Royann Shivers recommends that you schedule a follow-up appointment in 12 months. You will receive a reminder letter in the mail two months in advance. If you don't receive a letter, please call our office to schedule the follow-up appointment.  If you need a refill on your cardiac medications before your next appointment, please call your pharmacy.    Signed, Thurmon Fair, MD  06/15/2016 1:52 PM    [facility] [address] [place], [place], [place]  [address] Phone: [phone]; Fax: [phone]

## 2016-06-21 ENCOUNTER — Telehealth: Payer: Self-pay | Admitting: Cardiovascular Disease

## 2016-06-21 NOTE — Telephone Encounter (Signed)
Devon Solis is calling to get an order for Lab Work , for CBC , CMP and Lipid Panel. Please fax over the order to fax#[phone].   Thanks

## 2016-06-21 NOTE — Telephone Encounter (Signed)
Returned call to Inetta Fermoina at [facility]-states they received orders for lab work but did not have MD signature on them.  Requesting to resend lab orders with MD signature.  Made aware MD is OOO until Wednesday.    Will send to primary to have MD sign and orders refaxed on Wednesday.  Fax # [phone].

## 2016-06-24 NOTE — Telephone Encounter (Signed)
Signed lab orders faxed to Inetta Fermoina at [facility] to fax number provided.

## 2016-08-29 ENCOUNTER — Other Ambulatory Visit: Payer: Self-pay | Admitting: Cardiovascular Disease

## 2016-08-30 NOTE — Telephone Encounter (Signed)
Rx(s) sent to pharmacy electronically.  

## 2016-10-05 ENCOUNTER — Other Ambulatory Visit: Payer: Self-pay | Admitting: Cardiovascular Disease

## 2016-10-05 NOTE — Telephone Encounter (Signed)
Rx(s) sent to pharmacy electronically.  

## 2016-11-25 ENCOUNTER — Other Ambulatory Visit: Payer: Self-pay

## 2016-11-25 MED ORDER — ISOSORBIDE MONONITRATE ER 60 MG PO TB24: 60.0000 mg | ORAL_TABLET | Freq: Every day | ORAL | 2 refills | Status: DC

## 2016-11-25 MED ORDER — METOPROLOL TARTRATE 25 MG PO TABS: 12.5000 mg | ORAL_TABLET | Freq: Two times a day (BID) | ORAL | 2 refills | Status: DC

## 2016-11-25 MED ORDER — ATORVASTATIN CALCIUM 20 MG PO TABS
20.0000 mg | ORAL_TABLET | Freq: Every day | ORAL | 3 refills | Status: DC
Start: 2016-11-25 — End: 2017-06-23

## 2016-11-25 MED ORDER — PANTOPRAZOLE SODIUM 40 MG PO TBEC: 40.0000 mg | DELAYED_RELEASE_TABLET | Freq: Every day | ORAL | 2 refills | Status: DC

## 2016-11-25 MED ORDER — IRBESARTAN 150 MG PO TABS: 150.0000 mg | ORAL_TABLET | Freq: Every day | ORAL | 2 refills | Status: DC

## 2017-05-03 ENCOUNTER — Telehealth: Payer: Self-pay | Admitting: Cardiovascular Disease

## 2017-05-03 MED ORDER — ISOSORBIDE MONONITRATE ER 60 MG PO TB24: 60.0000 mg | ORAL_TABLET | Freq: Every day | ORAL | 2 refills | Status: DC

## 2017-05-03 NOTE — Telephone Encounter (Signed)
°*  STAT* If patient is at the pharmacy, call can be transferred to refill team.   1. Which medications need to be refilled? (please list name of each medication and dose if known) Isosoreide MonoNitrate ER Tabs 60mg  -- needs a new prescription sent   2. Which pharmacy/location (including street and city if local pharmacy) is medication to be sent to?[facility] [place] [phone]  3. Do they need a 30 day or 90 day supply? 30

## 2017-05-03 NOTE — Telephone Encounter (Signed)
Refill sent to the pharmacy electronically.  

## 2017-05-18 ENCOUNTER — Other Ambulatory Visit: Payer: Self-pay

## 2017-05-18 MED ORDER — PANTOPRAZOLE SODIUM 40 MG PO TBEC: 40.0000 mg | DELAYED_RELEASE_TABLET | Freq: Every day | ORAL | 0 refills | Status: DC

## 2017-05-18 MED ORDER — METOPROLOL TARTRATE 25 MG PO TABS: 12.5000 mg | ORAL_TABLET | Freq: Two times a day (BID) | ORAL | 0 refills | Status: DC

## 2017-05-18 MED ORDER — IRBESARTAN 150 MG PO TABS: 150.0000 mg | ORAL_TABLET | Freq: Every day | ORAL | 0 refills | Status: DC

## 2017-06-22 ENCOUNTER — Other Ambulatory Visit: Payer: Self-pay | Admitting: Cardiovascular Disease

## 2017-06-23 ENCOUNTER — Other Ambulatory Visit: Payer: Self-pay

## 2017-06-23 ENCOUNTER — Ambulatory Visit (INDEPENDENT_AMBULATORY_CARE_PROVIDER_SITE_OTHER): Payer: Medicare Other | Admitting: Cardiovascular Disease

## 2017-06-23 ENCOUNTER — Encounter: Payer: Self-pay | Admitting: Cardiovascular Disease

## 2017-06-23 VITALS — BP 142/88 | Ht 67.0 in | Wt 138.2 lb

## 2017-06-23 DIAGNOSIS — I340 Nonrheumatic mitral (valve) insufficiency: Secondary | ICD-10-CM

## 2017-06-23 DIAGNOSIS — I2510 Atherosclerotic heart disease of native coronary artery without angina pectoris: Secondary | ICD-10-CM

## 2017-06-23 DIAGNOSIS — E785 Hyperlipidemia, unspecified: Secondary | ICD-10-CM | POA: Diagnosis not present

## 2017-06-23 DIAGNOSIS — I10 Essential (primary) hypertension: Secondary | ICD-10-CM | POA: Diagnosis not present

## 2017-06-23 NOTE — Progress Notes (Signed)
Cardiology Office Note    Date:  06/25/2017   ID:  Devon Solis, DOB July 28, 1923, MRN [id number]  PCP:  Arma Heading, MD  Cardiologist:   Thurmon Fair, MD   chief complaint: cough   History of Present Illness:  Devon Solis is a 83 y.o. male with extensive history of coronary artery disease, not requiring revascularization since 2004, hyperlipidemia, hypertension, mitral valve prolapse with moderate mitral insufficiency, mild to moderate aortic insufficiency returning for routine follow-up.  He is doing remarkably well. He walks roughly a mile per day every day. It takes him about 35 minutes to walk, depending on how many of his friends he meets at the gym. He has not had dizziness or syncope and denies exertional angina or dyspnea. He has not been troubled by leg edema or claudication. He does not have abdominal discomfort. Denies focal neurological events. His lipid profile is followed by his PCP and was reportedly "great".  He has an extensive history of coronary problems and over the years has received drug-eluting stents: 3 stents in the right coronary artery and 2 in the left circumflex coronary artery. His most recent cardiac catheterization that led to intervention was in 2004. Followup coronary angiograms performed in 2007 and 2013 show that his previously placed stents were widely patent. On both occasions there is mention of a 80% stenosis in the bifurcation of a high first diagonal artery (which was felt to be less than 2 mm in diameter, not amenable to percutaneous revascularization). Previously reported have a small abdominal aortic aneurysm, this was not described on a follow-up study in 2013 or on the study from May 2017. This echoes have described mild aortic insufficiency and mild mitral valve prolapse with moderate MR. Most recent assessment of left ventricular ejection fraction was borderline normal at 50-55%   Past Medical History:  Diagnosis Date  . Abdominal aortic  aneurysm (HCC)    small, last ulrasound 04/10/12,   . Arthritis   . Atrial fibrillation (HCC)   . Bleeding stomach ulcer   . CAD (coronary artery disease) 09/24/2012   extensive stents/PCI to RCA and LCX, last cath 09/2012- high moderate sized 1st diag. with 80% stenosis. no nucs since cath  . CHF (congestive heart failure) (HCC)   . DVT (deep venous thrombosis) (HCC)   . Dyslipidemia 09/24/2012  . Esophageal stricture   . GERD (gastroesophageal reflux disease) 09/24/2012  . Hiatal hernia   . History of gallstones   . HTN (hypertension) 09/24/2012  . Mild aortic insufficiency    last echo 04/2012,  EF 50-55%, moderate MR, mild MVP  . PVCs (premature ventricular contractions) 04/17/2015    Past Surgical History:  Procedure Laterality Date  . APPENDECTOMY    . CARDIAC CATHETERIZATION  02/10/06   Patent stents residual LAD disease  . CARDIAC CATHETERIZATION  09/24/2012   patent stents, 80% 1st diag branch though not felt responsible for symptoms.  . CHOLECYSTECTOMY    . CORONARY ANGIOPLASTY  05/2003   3 cypher stents placed urgently for inf. MI to -RCA, 2 cypher stents -OM1 & 1 cypher stent to LCX  . ESOPHAGOGASTRODUODENOSCOPY (EGD) WITH ESOPHAGEAL DILATION  09/26/2012   Procedure: ESOPHAGOGASTRODUODENOSCOPY (EGD) WITH ESOPHAGEAL DILATION;  Surgeon: Beverley Fiedler, MD;  Location: [facility] ENDOSCOPY;  Service: Gastroenterology;  Laterality: N/A;  . LEFT HEART CATH N/A 09/24/2012   Procedure: LEFT HEART CATH;  Surgeon: Runell Gess, MD;  Location: [facility] CATH LAB;  Service: Cardiovascular;  Laterality: N/A;  . NASAL  SINUS SURGERY      Current Medications: Outpatient Medications Prior to Visit  Medication Sig Dispense Refill  . Ascorbic Acid (VITAMIN C) 1000 MG tablet Take 1,000 mg by mouth daily.    Marland Kitchen aspirin 81 MG tablet Take 81 mg by mouth daily.    Marland Kitchen atorvastatin (LIPITOR) 20 MG tablet Take 1 tablet (20 mg total) by mouth at bedtime. 90 tablet 3  . irbesartan (AVAPRO) 150 MG tablet Take 1  tablet (150 mg total) by mouth daily. 90 tablet 0  . isosorbide mononitrate (IMDUR) 60 MG 24 hr tablet Take 1 tablet (60 mg total) by mouth daily. 30 tablet 2  . metoprolol tartrate (LOPRESSOR) 25 MG tablet TAKE 1/2 TABLET TWICE A DAY 30 tablet 0  . Multiple Vitamin (MULTIVITAMIN WITH MINERALS) TABS Take 1 tablet by mouth daily.    . nitroGLYCERIN (NITROSTAT) 0.4 MG SL tablet Place 0.4 mg under the tongue every 5 (five) minutes as needed.    . pantoprazole (PROTONIX) 40 MG tablet Take 1 tablet (40 mg total) by mouth daily. 90 tablet 0  . benzonatate (TESSALON) 100 MG capsule Take 1 capsule (100 mg total) by mouth every 8 (eight) hours as needed for cough. (Patient not taking: Reported on 06/23/2017) 30 capsule 0   No facility-administered medications prior to visit.      Allergies:   Penicillins   Social History   Social History  . Marital status: Widowed    Spouse name: N/A  . Number of children: 4  . Years of education: N/A   Occupational History  . retired    Social History Main Topics  . Smoking status: Former Smoker    Types: Cigarettes    Quit date: 08/18/1965  . Smokeless tobacco: Never Used  . Alcohol use No  . Drug use: No  . Sexual activity: Not Asked   Other Topics Concern  . None   Social History Narrative  . None     Family History:  The patient's family history includes Aneurysm in his daughter; Breast cancer in his sister; Cancer in his mother; Colon polyps in his daughter; Diabetes in his brother, daughter, and father; Heart disease in his father; Liver disease in his daughter.   ROS:   Please see the history of present illness.    ROS All other systems reviewed and are negative.   PHYSICAL EXAM:   VS:  BP (!) 142/88 (BP Location: Right Arm, Patient Position: Sitting, Cuff Size: Normal)   Ht 5\' 7"  (1.702 m)   Wt 138 lb 3.2 oz (62.7 kg)   BMI 21.65 kg/m     General: Alert, oriented x3, no distress Head: no evidence of trauma, PERRL, EOMI, no  exophtalmos or lid lag, no myxedema, no xanthelasma; normal ears, nose and oropharynx Neck: normal jugular venous pulsations and no hepatojugular reflux; brisk carotid pulses without delay and no carotid bruits Chest: clear to auscultation, no signs of consolidation by percussion or palpation, normal fremitus, symmetrical and full respiratory excursions Cardiovascular: normal position and quality of the apical impulse, regular rhythm, normal first and second heart sounds, grade 2/6 late systolic murmur heard at the apex, highly consistent with prolapse related mitral regurgitation, no diastolic murmurs, rubs or gallops Abdomen: no tenderness or distention, no masses by palpation, no abnormal pulsatility or arterial bruits, normal bowel sounds, no hepatosplenomegaly Extremities: no clubbing, cyanosis or edema; 2+ radial, ulnar and brachial pulses bilaterally; 2+ right femoral, posterior tibial and dorsalis pedis pulses; 2+ left femoral,  posterior tibial and dorsalis pedis pulses; no subclavian or femoral bruits Neurological: grossly nonfocal Psych: euthymic mood, full affect  Wt Readings from Last 3 Encounters:  06/23/17 138 lb 3.2 oz (62.7 kg)  06/14/16 138 lb (62.6 kg)  04/16/15 138 lb (62.6 kg)      Studies/Labs Reviewed:   EKG:  EKG is ordered today.  The ekg ordered today demonstrates Normal sinus rhythm, left axis deviation, voltage criteria for LVH, otherwise normal .  ASSESSMENT:    1. Coronary artery disease involving native coronary artery of native heart without angina pectoris   2. Essential hypertension   3. Dyslipidemia   4. Non-rheumatic mitral regurgitation      PLAN:  In order of problems listed above:  1. CAD: Asymptomatic, no need for revascularization in the last 13 years. Inferoseptal scar without ischemia by myocardial perfusion study 2011. No need for percutaneous revascularization since 2004. Most recent angiography 2013. He is physically active. The focus  remains on risk factor control. 2. HTN: Blood pressure is a little high today, reportedly much lower when he checks it at the gym or at the office with his PCP. He is on 3 different vasoactive medications (olmesartan, metoprolol, isosorbide). No changes made to his medications today. 3. HLP:  On a highly active statin. 4. MVP/MR: Asymptomatic, unlikely to be clinically relevant. I don't think we will be sending this nonagenarian to heart surgery.   Medication Adjustments/Labs and Tests Ordered: Current medicines are reviewed at length with the patient today.  Concerns regarding medicines are outlined above.  Medication changes, Labs and Tests ordered today are listed in the Patient Instructions below. Patient Instructions  Dr Royann Shivers recommends that you schedule a follow-up appointment in 12 months. You will receive a reminder letter in the mail two months in advance. If you don't receive a letter, please call our office to schedule the follow-up appointment.  If you need a refill on your cardiac medications before your next appointment, please call your pharmacy.    Signed, Thurmon Fair, MD  06/25/2017 5:32 PM    [facility] [address] [place], [place], [place]  [address] Phone: [phone]; Fax: [phone]

## 2017-06-23 NOTE — Patient Instructions (Signed)
Dr Croitoru recommends that you schedule a follow-up appointment in 12 months. You will receive a reminder letter in the mail two months in advance. If you don't receive a letter, please call our office to schedule the follow-up appointment.  If you need a refill on your cardiac medications before your next appointment, please call your pharmacy. 

## 2017-06-30 MED ORDER — IRBESARTAN 150 MG PO TABS: 150.0000 mg | ORAL_TABLET | Freq: Every day | ORAL | 0 refills | Status: DC

## 2017-06-30 MED ORDER — METOPROLOL TARTRATE 25 MG PO TABS: 12.5000 mg | ORAL_TABLET | Freq: Two times a day (BID) | ORAL | 3 refills | Status: DC

## 2017-06-30 MED ORDER — PANTOPRAZOLE SODIUM 40 MG PO TBEC: 40.0000 mg | DELAYED_RELEASE_TABLET | Freq: Every day | ORAL | 3 refills | Status: DC

## 2017-06-30 MED ORDER — ATORVASTATIN CALCIUM 20 MG PO TABS: 20.0000 mg | ORAL_TABLET | Freq: Every day | ORAL | 3 refills | Status: DC

## 2017-06-30 MED ORDER — ISOSORBIDE MONONITRATE ER 60 MG PO TB24: 60.0000 mg | ORAL_TABLET | Freq: Every day | ORAL | 3 refills | Status: DC

## 2017-07-11 ENCOUNTER — Other Ambulatory Visit: Payer: Self-pay | Admitting: Cardiovascular Disease

## 2017-11-14 ENCOUNTER — Other Ambulatory Visit: Payer: Self-pay | Admitting: Cardiovascular Disease

## 2017-11-14 NOTE — Telephone Encounter (Signed)
Rx(s) sent to pharmacy electronically.  

## 2017-12-10 ENCOUNTER — Encounter: Payer: Self-pay | Admitting: Cardiovascular Disease

## 2018-01-11 ENCOUNTER — Other Ambulatory Visit: Payer: Self-pay | Admitting: Cardiovascular Disease

## 2018-03-08 ENCOUNTER — Other Ambulatory Visit: Payer: Self-pay | Admitting: Cardiovascular Disease

## 2018-03-08 NOTE — Telephone Encounter (Signed)
Rx(s) sent to pharmacy electronically.  

## 2018-06-27 ENCOUNTER — Other Ambulatory Visit: Payer: Self-pay

## 2018-06-27 MED ORDER — ISOSORBIDE MONONITRATE ER 60 MG PO TB24: 60.0000 mg | ORAL_TABLET | Freq: Every day | ORAL | 0 refills | Status: DC

## 2018-06-27 MED ORDER — ATORVASTATIN CALCIUM 20 MG PO TABS: 20.0000 mg | ORAL_TABLET | Freq: Every day | ORAL | 0 refills | Status: DC

## 2018-07-30 ENCOUNTER — Other Ambulatory Visit: Payer: Self-pay

## 2018-07-31 MED ORDER — ISOSORBIDE MONONITRATE ER 60 MG PO TB24: 60.0000 mg | ORAL_TABLET | Freq: Every day | ORAL | 0 refills | Status: DC

## 2018-07-31 MED ORDER — ATORVASTATIN CALCIUM 20 MG PO TABS: 20.0000 mg | ORAL_TABLET | Freq: Every day | ORAL | 0 refills | Status: DC

## 2018-07-31 MED ORDER — METOPROLOL TARTRATE 25 MG PO TABS: 12.5000 mg | ORAL_TABLET | Freq: Two times a day (BID) | ORAL | 0 refills | Status: DC

## 2018-07-31 MED ORDER — IRBESARTAN 150 MG PO TABS: 150.0000 mg | ORAL_TABLET | Freq: Every day | ORAL | 0 refills | Status: DC

## 2018-07-31 MED ORDER — PANTOPRAZOLE SODIUM 40 MG PO TBEC: 40.0000 mg | DELAYED_RELEASE_TABLET | Freq: Every day | ORAL | 0 refills | Status: DC

## 2018-10-29 ENCOUNTER — Other Ambulatory Visit: Payer: Self-pay | Admitting: Cardiovascular Disease

## 2018-11-06 ENCOUNTER — Encounter: Payer: Self-pay | Admitting: Cardiovascular Disease

## 2018-11-06 ENCOUNTER — Other Ambulatory Visit: Payer: Self-pay

## 2018-11-06 ENCOUNTER — Encounter

## 2018-11-06 ENCOUNTER — Ambulatory Visit (INDEPENDENT_AMBULATORY_CARE_PROVIDER_SITE_OTHER): Payer: Medicare Other | Admitting: Cardiovascular Disease

## 2018-11-06 VITALS — BP 92/66 | HR 57 | Ht 66.0 in | Wt 134.0 lb

## 2018-11-06 DIAGNOSIS — I340 Nonrheumatic mitral (valve) insufficiency: Secondary | ICD-10-CM | POA: Diagnosis not present

## 2018-11-06 DIAGNOSIS — I2510 Atherosclerotic heart disease of native coronary artery without angina pectoris: Secondary | ICD-10-CM | POA: Diagnosis not present

## 2018-11-06 DIAGNOSIS — E785 Hyperlipidemia, unspecified: Secondary | ICD-10-CM

## 2018-11-06 DIAGNOSIS — I10 Essential (primary) hypertension: Secondary | ICD-10-CM | POA: Diagnosis not present

## 2018-11-06 MED ORDER — NITROGLYCERIN 0.4 MG SL SUBL: 0.4000 mg | SUBLINGUAL_TABLET | SUBLINGUAL | 3 refills | Status: DC | PRN

## 2018-11-06 MED ORDER — ISOSORBIDE MONONITRATE ER 60 MG PO TB24: 30.0000 mg | ORAL_TABLET | Freq: Every day | ORAL | 0 refills | Status: DC

## 2018-11-06 MED ORDER — PANTOPRAZOLE SODIUM 40 MG PO TBEC: 40.0000 mg | DELAYED_RELEASE_TABLET | Freq: Every day | ORAL | 3 refills | Status: DC

## 2018-11-06 MED ORDER — IRBESARTAN 150 MG PO TABS: 150.0000 mg | ORAL_TABLET | Freq: Every day | ORAL | 3 refills | Status: DC

## 2018-11-06 MED ORDER — ATORVASTATIN CALCIUM 20 MG PO TABS: 20.0000 mg | ORAL_TABLET | Freq: Every day | ORAL | 3 refills | Status: DC

## 2018-11-06 MED ORDER — METOPROLOL TARTRATE 25 MG PO TABS: 12.5000 mg | ORAL_TABLET | Freq: Two times a day (BID) | ORAL | 3 refills | Status: DC

## 2018-11-06 MED ORDER — ASPIRIN EC 81 MG PO TBEC: 81.0000 mg | DELAYED_RELEASE_TABLET | Freq: Every day | ORAL | Status: AC

## 2018-11-06 NOTE — Progress Notes (Signed)
Cardiology Office Note    Date:  11/06/2018   ID:  Devon Solis, DOB 24-Jul-1923, MRN [id number]  PCP:  Arma Heading, MD  Cardiologist:   Thurmon Fair, MD   chief complaint: cough   History of Present Illness:  Devon Solis is a 85 y.o. male with extensive history of coronary artery disease, not requiring revascularization since 2004, hyperlipidemia, hypertension, mitral valve prolapse with moderate mitral insufficiency, mild to moderate aortic insufficiency returning for routine follow-up.  He continues to do quite well.  He walks 20 minutes every day and goes to the [facility] occasionally. The patient specifically denies any chest pain at rest exertion, dyspnea at rest or with exertion, orthopnea, paroxysmal nocturnal dyspnea, syncope, palpitations, focal neurological deficits, intermittent claudication, lower extremity edema, unexplained weight gain, cough, hemoptysis or wheezing.  His lipid profile is followed by his PCP.  He has an extensive history of coronary problems and over the years has received drug-eluting stents: 3 stents in the right coronary artery and 2 in the left circumflex coronary artery. His most recent cardiac catheterization that led to intervention was in 2004. Followup coronary angiograms performed in 2007 and 2013 show that his previously placed stents were widely patent. On both occasions there is mention of a 80% stenosis in the bifurcation of a high first diagonal artery (which was felt to be less than 2 mm in diameter, not amenable to percutaneous revascularization). Previously reported have a small abdominal aortic aneurysm, this was not described on a follow-up study in 2013 or on the study from May 2017. This echoes have described mild aortic insufficiency and mild mitral valve prolapse with moderate MR. Most recent assessment of left ventricular ejection fraction was borderline normal at 50-55%   Past Medical History:  Diagnosis Date  . Abdominal aortic aneurysm  (HCC)    small, last ulrasound 04/10/12,   . Arthritis   . Atrial fibrillation (HCC)   . Bleeding stomach ulcer   . CAD (coronary artery disease) 09/24/2012   extensive stents/PCI to RCA and LCX, last cath 09/2012- high moderate sized 1st diag. with 80% stenosis. no nucs since cath  . CHF (congestive heart failure) (HCC)   . DVT (deep venous thrombosis) (HCC)   . Dyslipidemia 09/24/2012  . Esophageal stricture   . GERD (gastroesophageal reflux disease) 09/24/2012  . Hiatal hernia   . History of gallstones   . HTN (hypertension) 09/24/2012  . Mild aortic insufficiency    last echo 04/2012,  EF 50-55%, moderate MR, mild MVP  . PVCs (premature ventricular contractions) 04/17/2015    Past Surgical History:  Procedure Laterality Date  . APPENDECTOMY    . CARDIAC CATHETERIZATION  02/10/06   Patent stents residual LAD disease  . CARDIAC CATHETERIZATION  09/24/2012   patent stents, 80% 1st diag branch though not felt responsible for symptoms.  . CHOLECYSTECTOMY    . CORONARY ANGIOPLASTY  05/2003   3 cypher stents placed urgently for inf. MI to -RCA, 2 cypher stents -OM1 & 1 cypher stent to LCX  . ESOPHAGOGASTRODUODENOSCOPY (EGD) WITH ESOPHAGEAL DILATION  09/26/2012   Procedure: ESOPHAGOGASTRODUODENOSCOPY (EGD) WITH ESOPHAGEAL DILATION;  Surgeon: Beverley Fiedler, MD;  Location: [facility] ENDOSCOPY;  Service: Gastroenterology;  Laterality: N/A;  . LEFT HEART CATH N/A 09/24/2012   Procedure: LEFT HEART CATH;  Surgeon: Runell Gess, MD;  Location: [facility] CATH LAB;  Service: Cardiovascular;  Laterality: N/A;  . NASAL SINUS SURGERY      Current Medications: Outpatient Medications Prior to Visit  Medication Sig Dispense Refill  . Ascorbic Acid (VITAMIN C) 1000 MG tablet Take 1,000 mg by mouth daily.    Marland Kitchen. atorvastatin (LIPITOR) 20 MG tablet Take 1 tablet (20 mg total) by mouth at bedtime. Please schedule an appointment For further refills 90 tablet 0  . irbesartan (AVAPRO) 150 MG tablet Take 1 tablet (150  mg total) by mouth daily. 90 tablet 0  . metoprolol tartrate (LOPRESSOR) 25 MG tablet Take 0.5 tablets (12.5 mg total) by mouth 2 (two) times daily. 135 tablet 0  . Multiple Vitamin (MULTIVITAMIN WITH MINERALS) TABS Take 1 tablet by mouth daily.    . nitroGLYCERIN (NITROSTAT) 0.4 MG SL tablet Place 0.4 mg under the tongue every 5 (five) minutes as needed.    . pantoprazole (PROTONIX) 40 MG tablet TAKE 1 TABLET DAILY 90 tablet 0  . aspirin 325 MG tablet Take 162.5 mg by mouth daily.    . isosorbide mononitrate (IMDUR) 60 MG 24 hr tablet Take 1 tablet (60 mg total) by mouth daily. Please schedule an appointment for refills 90 tablet 0  . aspirin 81 MG tablet Take 81 mg by mouth daily.     No facility-administered medications prior to visit.      Allergies:   Penicillins   Social History   Socioeconomic History  . Marital status: Widowed    Spouse name: Not on file  . Number of children: 4  . Years of education: Not on file  . Highest education level: Not on file  Occupational History  . Occupation: retired  Engineer, productionocial Needs  . Financial resource strain: Not on file  . Food insecurity:    Worry: Not on file    Inability: Not on file  . Transportation needs:    Medical: Not on file    Non-medical: Not on file  Tobacco Use  . Smoking status: Former Smoker    Types: Cigarettes    Last attempt to quit: 08/18/1965    Years since quitting: 53.2  . Smokeless tobacco: Never Used  Substance and Sexual Activity  . Alcohol use: No  . Drug use: No  . Sexual activity: Not on file  Lifestyle  . Physical activity:    Days per week: Not on file    Minutes per session: Not on file  . Stress: Not on file  Relationships  . Social connections:    Talks on phone: Not on file    Gets together: Not on file    Attends religious service: Not on file    Active member of club or organization: Not on file    Attends meetings of clubs or organizations: Not on file    Relationship status: Not on  file  Other Topics Concern  . Not on file  Social History Narrative  . Not on file     Family History:  The patient's family history includes Aneurysm in his daughter; Breast cancer in his sister; Cancer in his mother; Colon polyps in his daughter; Diabetes in his brother, daughter, and father; Heart disease in his father; Liver disease in his daughter.   ROS:   Please see the history of present illness.    ROS all other systems are reviewed and are negative   PHYSICAL EXAM:   VS:  BP 92/66 (BP Location: Left Arm, Patient Position: Sitting, Cuff Size: Normal)   Pulse (!) 57   Ht 5\' 6"  (1.676 m)   Wt 134 lb (60.8 kg)   BMI 21.63 kg/m  General: Alert, oriented x3, no distress, appears remarkably fit for his age Head: no evidence of trauma, PERRL, EOMI, no exophtalmos or lid lag, no myxedema, no xanthelasma; normal ears, nose and oropharynx Neck: normal jugular venous pulsations and no hepatojugular reflux; brisk carotid pulses without delay and no carotid bruits Chest: clear to auscultation, no signs of consolidation by percussion or palpation, normal fremitus, symmetrical and full respiratory excursions Cardiovascular: normal position and quality of the apical impulse, regular rhythm, normal first and second heart sounds, 2/6 late peaking apical systolic murmur, no diastolic murmurs, rubs or gallops Abdomen: no tenderness or distention, no masses by palpation, no abnormal pulsatility or arterial bruits, normal bowel sounds, no hepatosplenomegaly Extremities: no clubbing, cyanosis or edema; 2+ radial, ulnar and brachial pulses bilaterally; 2+ right femoral, posterior tibial and dorsalis pedis pulses; 2+ left femoral, posterior tibial and dorsalis pedis pulses; no subclavian or femoral bruits Neurological: grossly nonfocal Psych: Normal mood and affect    Wt Readings from Last 3 Encounters:  11/06/18 134 lb (60.8 kg)  06/23/17 138 lb 3.2 oz (62.7 kg)  06/14/16 138 lb (62.6 kg)        Studies/Labs Reviewed:   EKG:  EKG is ordered today.  The ekg ordered today demonstrates Normal sinus rhythm, left axis deviation, voltage criteria for LVH, otherwise normal .  ASSESSMENT:    1. Coronary artery disease involving native coronary artery of native heart without angina pectoris   2. Essential hypertension   3. Dyslipidemia   4. Nonrheumatic mitral valve regurgitation      PLAN:  In order of problems listed above:  1. CAD: No angina in several years.  We will wean him off the isosorbide.  Reduce aspirin to 81 mg daily.  Asymptomatic, no need for revascularization in the last 13 years. Inferoseptal scar without ischemia by myocardial perfusion study 2011. No need for percutaneous revascularization since 2004. Most recent angiography 2013. He is physically active. The focus remains on risk factor control. 2. HTN: Excellent control. 3. HLP:  On a highly active statin.  Get labs from PCP 4. MVP/MR: His murmur is only heard in late systole, consistent with prolapse associated mitral insufficiency and is not any louder than in the past.  Asymptomatic, unlikely to be clinically relevant. I don't think we will be sending this nonagenarian to heart surgery.  No plan for routine echo monitoring unless he becomes symptomatic.   Medication Adjustments/Labs and Tests Ordered: Current medicines are reviewed at length with the patient today.  Concerns regarding medicines are outlined above.  Medication changes, Labs and Tests ordered today are listed in the Patient Instructions below. Patient Instructions  Medication Instructions:  Dr Royann Shivers has recommended making the following medication changes: 1. DECREASE Aspirin to 81 mg daily 2. DECREASE Isosorbide to 30 mg daily for 1 month - STOP if you don't have any chest pain  If you need a refill on your cardiac medications before your next appointment, please call your pharmacy.   Follow-Up: At [facility], you and your health  needs are our priority.  As part of our continuing mission to provide you with exceptional heart care, we have created designated Provider Care Teams.  These Care Teams include your primary Cardiologist (physician) and Advanced Practice Providers (APPs -  Physician Assistants and Nurse Practitioners) who all work together to provide you with the care you need, when you need it. You will need a follow up appointment in 12 months. You may see Thurmon Fair, MD  or one of the following Advanced Practice Providers on your designated Care Team: Azalee CourseHao Meng, [place]-C . Micah FlesherAngela Duke, PA-C . You will receive a reminder letter in the mail two months in advance. If you don't receive a letter, please call our office to schedule the follow-up appointment.    Signed, Thurmon FairMihai Terralyn Matsumura, MD  11/06/2018 10:50 AM    [facility] [address], [place], [place]  [address] Phone: [phone]; Fax: [phone]

## 2018-11-06 NOTE — Patient Instructions (Addendum)
Medication Instructions:  Dr Royann Shivers has recommended making the following medication changes: 1. DECREASE Aspirin to 81 mg daily 2. DECREASE Isosorbide to 30 mg daily for 1 month - STOP if you don't have any chest pain  If you need a refill on your cardiac medications before your next appointment, please call your pharmacy.   Follow-Up: At [facility], you and your health needs are our priority.  As part of our continuing mission to provide you with exceptional heart care, we have created designated Provider Care Teams.  These Care Teams include your primary Cardiologist (physician) and Advanced Practice Providers (APPs -  Physician Assistants and Nurse Practitioners) who all work together to provide you with the care you need, when you need it. You will need a follow up appointment in 12 months. You may see Thurmon Fair, MD or one of the following Advanced Practice Providers on your designated Care Team: [place], [place] . Micah Flesher, PA-C . You will receive a reminder letter in the mail two months in advance. If you don't receive a letter, please call our office to schedule the follow-up appointment.

## 2018-11-06 NOTE — Telephone Encounter (Signed)
Rx(s) sent to pharmacy electronically.  

## 2018-11-09 ENCOUNTER — Telehealth: Payer: Self-pay | Admitting: Cardiovascular Disease

## 2018-11-09 MED ORDER — ISOSORBIDE MONONITRATE ER 30 MG PO TB24: 30.0000 mg | ORAL_TABLET | Freq: Every day | ORAL | 1 refills | Status: DC

## 2018-11-09 NOTE — Telephone Encounter (Signed)
Pt Devon Solis was decreased to 30mg  daily on 11/06/18 by Dr.Croitoru with the following instruction.  DECREASE Isosorbide to 30 mg daily for 1 month - STOP if you don't have any chest pain  Refilled for lower dose and sent to pt mail order pharmacy. Devon Solis 30mg  daily #90 R-1

## 2018-11-09 NOTE — Telephone Encounter (Signed)
New Message         *STAT* If patient is at the pharmacy, call can be transferred to refill team.   1. Which medications need to be refilled? (please list name of each medication and dose if known) isosorbide mononitrate (IMDUR) 60 MG 24 hr tablet  2. Which pharmacy/location (including street and city if local pharmacy) is medication to be sent to? One on file  3. Do they need a 30 day or 90 day supply? 90

## 2019-02-06 ENCOUNTER — Other Ambulatory Visit: Payer: Self-pay | Admitting: Pharmacist

## 2019-02-06 ENCOUNTER — Telehealth: Payer: Self-pay

## 2019-02-06 MED ORDER — IRBESARTAN 300 MG PO TABS: 150.0000 mg | ORAL_TABLET | Freq: Every day | ORAL | 0 refills | Status: DC

## 2019-02-26 ENCOUNTER — Other Ambulatory Visit: Payer: Self-pay

## 2019-04-02 NOTE — Telephone Encounter (Signed)
Refill

## 2019-10-29 ENCOUNTER — Other Ambulatory Visit: Payer: Self-pay | Admitting: *Deleted

## 2019-10-29 MED ORDER — METOPROLOL TARTRATE 25 MG PO TABS: 12.5000 mg | ORAL_TABLET | Freq: Two times a day (BID) | ORAL | 0 refills | Status: DC

## 2019-11-11 ENCOUNTER — Other Ambulatory Visit: Payer: Self-pay | Admitting: Cardiovascular Disease

## 2019-11-26 ENCOUNTER — Telehealth: Payer: Self-pay | Admitting: Cardiovascular Disease

## 2019-11-26 NOTE — Telephone Encounter (Signed)
The patient's daughter has been made aware that she may come with the patient. A virtual appointment has been offered. She will call back if she decides that they would prefer a virtual appointment on 1/27.

## 2019-11-26 NOTE — Telephone Encounter (Signed)
Patient's daughter Britta Mccreedy calling requesting to come with the patient to his appointment 1/27. She states he has very bad hearing and may not remember what is discussed in the appointment.

## 2019-11-28 ENCOUNTER — Ambulatory Visit (INDEPENDENT_AMBULATORY_CARE_PROVIDER_SITE_OTHER): Payer: Medicare Other | Admitting: Cardiovascular Disease

## 2019-11-28 ENCOUNTER — Other Ambulatory Visit: Payer: Self-pay

## 2019-11-28 ENCOUNTER — Encounter: Payer: Self-pay | Admitting: Cardiovascular Disease

## 2019-11-28 VITALS — BP 104/78 | HR 70 | Ht 68.0 in | Wt 136.6 lb

## 2019-11-28 DIAGNOSIS — I340 Nonrheumatic mitral (valve) insufficiency: Secondary | ICD-10-CM

## 2019-11-28 DIAGNOSIS — I341 Nonrheumatic mitral (valve) prolapse: Secondary | ICD-10-CM

## 2019-11-28 DIAGNOSIS — I2510 Atherosclerotic heart disease of native coronary artery without angina pectoris: Secondary | ICD-10-CM

## 2019-11-28 DIAGNOSIS — I10 Essential (primary) hypertension: Secondary | ICD-10-CM

## 2019-11-28 DIAGNOSIS — E7800 Pure hypercholesterolemia, unspecified: Secondary | ICD-10-CM | POA: Diagnosis not present

## 2019-11-28 MED ORDER — PANTOPRAZOLE SODIUM 40 MG PO TBEC: 40.0000 mg | DELAYED_RELEASE_TABLET | Freq: Every day | ORAL | 3 refills | Status: DC

## 2019-11-28 MED ORDER — METOPROLOL TARTRATE 25 MG PO TABS: 12.5000 mg | ORAL_TABLET | Freq: Two times a day (BID) | ORAL | 3 refills | Status: DC

## 2019-11-28 MED ORDER — IRBESARTAN 75 MG PO TABS: 75.0000 mg | ORAL_TABLET | Freq: Every day | ORAL | 3 refills | Status: DC

## 2019-11-28 MED ORDER — ATORVASTATIN CALCIUM 20 MG PO TABS: 20.0000 mg | ORAL_TABLET | Freq: Every day | ORAL | 3 refills | Status: DC

## 2019-11-28 NOTE — Patient Instructions (Signed)
Medication Instructions:  DECREASE the Irbesartan to 75 mg once daily  *If you need a refill on your cardiac medications before your next appointment, please call your pharmacy*  Lab Work: None ordered If you have labs (blood work) drawn today and your tests are completely normal, you will receive your results only by: Marland Kitchen MyChart Message (if you have MyChart) OR . A paper copy in the mail If you have any lab test that is abnormal or we need to change your treatment, we will call you to review the results.  Testing/Procedures: None ordered  Follow-Up: At [facility], you and your health needs are our priority.  As part of our continuing mission to provide you with exceptional heart care, we have created designated Provider Care Teams.  These Care Teams include your primary Cardiologist (physician) and Advanced Practice Providers (APPs -  Physician Assistants and Nurse Practitioners) who all work together to provide you with the care you need, when you need it.  Your next appointment:   12 month(s)  The format for your next appointment:   Either In Person or Virtual  Provider:   Thurmon Fair, MD

## 2019-11-28 NOTE — Progress Notes (Signed)
Cardiology Office Note    Date:  11/29/2019   ID:  Devon Solis, DOB [birth year], MRN [id number]  PCP:  Arma Heading, MD  Cardiologist:   Thurmon Fair, MD   chief complaint: cough   History of Present Illness:  Devon Solis is a 86 y.o. male with extensive history of coronary artery disease, not requiring revascularization since 2004, hyperlipidemia, hypertension, mitral valve prolapse with moderate mitral insufficiency, mild to moderate aortic insufficiency returning for routine follow-up.  He continues to do very well.  He can no longer go to the [facility], but he walks a mile a day in his apartment complex, every day.  He has occasional lightheadedness when he gets up in the morning or after bending over.  He tends to run a rather low blood pressure.  The patient specifically denies any chest pain at rest exertion, dyspnea at rest or with exertion, orthopnea, paroxysmal nocturnal dyspnea, syncope, palpitations, focal neurological deficits, intermittent claudication, lower extremity edema, unexplained weight gain, cough, hemoptysis or wheezing.   His lipid profile is followed by his PCP.  He has an extensive history of coronary problems and over the years has received drug-eluting stents: 3 stents in the right coronary artery and 2 in the left circumflex coronary artery. His most recent cardiac catheterization that led to intervention was in 2004. Followup coronary angiograms performed in 2007 and 2013 show that his previously placed stents were widely patent. On both occasions there is mention of a 80% stenosis in the bifurcation of a high first diagonal artery (which was felt to be less than 2 mm in diameter, not amenable to percutaneous revascularization). Previously reported have a small abdominal aortic aneurysm, this was not described on a follow-up study in 2013 or on the study from May 2017. This echoes have described mild aortic insufficiency and mild mitral valve prolapse with moderate  MR. Most recent assessment of left ventricular ejection fraction was borderline normal at 50-55%   Past Medical History:  Diagnosis Date  . Abdominal aortic aneurysm (HCC)    small, last ulrasound 04/10/12,   . Arthritis   . Atrial fibrillation (HCC)   . Bleeding stomach ulcer   . CAD (coronary artery disease) 09/24/2012   extensive stents/PCI to RCA and LCX, last cath 09/2012- high moderate sized 1st diag. with 80% stenosis. no nucs since cath  . CHF (congestive heart failure) (HCC)   . DVT (deep venous thrombosis) (HCC)   . Dyslipidemia 09/24/2012  . Esophageal stricture   . GERD (gastroesophageal reflux disease) 09/24/2012  . Hiatal hernia   . History of gallstones   . HTN (hypertension) 09/24/2012  . Mild aortic insufficiency    last echo 04/2012,  EF 50-55%, moderate MR, mild MVP  . PVCs (premature ventricular contractions) 04/17/2015    Past Surgical History:  Procedure Laterality Date  . APPENDECTOMY    . CARDIAC CATHETERIZATION  02/10/06   Patent stents residual LAD disease  . CARDIAC CATHETERIZATION  09/24/2012   patent stents, 80% 1st diag branch though not felt responsible for symptoms.  . CHOLECYSTECTOMY    . CORONARY ANGIOPLASTY  05/2003   3 cypher stents placed urgently for inf. MI to -RCA, 2 cypher stents -OM1 & 1 cypher stent to LCX  . ESOPHAGOGASTRODUODENOSCOPY (EGD) WITH ESOPHAGEAL DILATION  09/26/2012   Procedure: ESOPHAGOGASTRODUODENOSCOPY (EGD) WITH ESOPHAGEAL DILATION;  Surgeon: Beverley Fiedler, MD;  Location: [facility] ENDOSCOPY;  Service: Gastroenterology;  Laterality: N/A;  . LEFT HEART CATH N/A 09/24/2012   Procedure:  LEFT HEART CATH;  Surgeon: Lorretta Harp, MD;  Location[facility] CATH LAB;  Service: Cardiovascular;  Laterality: N/A;  . NASAL SINUS SURGERY      Current Medications: Outpatient Medications Prior to Visit  Medication Sig Dispense Refill  . Ascorbic Acid (VITAMIN C) 1000 MG tablet Take 1,000 mg by mouth daily.    Marland Kitchen aspirin EC 81 MG tablet Take 1  tablet (81 mg total) by mouth daily.    . Multiple Vitamin (MULTIVITAMIN WITH MINERALS) TABS Take 1 tablet by mouth daily.    . nitroGLYCERIN (NITROSTAT) 0.4 MG SL tablet Place 1 tablet (0.4 mg total) under the tongue every 5 (five) minutes as needed. 25 tablet 3  . atorvastatin (LIPITOR) 20 MG tablet TAKE 1 TABLET AT BEDTIME 90 tablet 3  . irbesartan (AVAPRO) 300 MG tablet Take 0.5 tablets (150 mg total) by mouth daily. 45 tablet 0  . metoprolol tartrate (LOPRESSOR) 25 MG tablet Take 0.5 tablets (12.5 mg total) by mouth 2 (two) times daily. 90 tablet 0  . pantoprazole (PROTONIX) 40 MG tablet Take 1 tablet (40 mg total) by mouth daily. 90 tablet 3  . isosorbide mononitrate (IMDUR) 30 MG 24 hr tablet Take 1 tablet (30 mg total) by mouth daily. (Patient not taking: Reported on 11/28/2019) 90 tablet 1   No facility-administered medications prior to visit.     Allergies:   Penicillins   Social History   Socioeconomic History  . Marital status: Widowed    Spouse name: Not on file  . Number of children: 4  . Years of education: Not on file  . Highest education level: Not on file  Occupational History  . Occupation: retired  Tobacco Use  . Smoking status: Former Smoker    Types: Cigarettes    Quit date: 08/18/1965    Years since quitting: 54.3  . Smokeless tobacco: Never Used  Substance and Sexual Activity  . Alcohol use: No  . Drug use: No  . Sexual activity: Not on file  Other Topics Concern  . Not on file  Social History Narrative  . Not on file   Social Determinants of Health   Financial Resource Strain:   . Difficulty of Paying Living Expenses: Not on file  Food Insecurity:   . Worried About Charity fundraiser in the Last Year: Not on file  . Ran Out of Food in the Last Year: Not on file  Transportation Needs:   . Lack of Transportation (Medical): Not on file  . Lack of Transportation (Non-Medical): Not on file  Physical Activity:   . Days of Exercise per Week: Not on  file  . Minutes of Exercise per Session: Not on file  Stress:   . Feeling of Stress : Not on file  Social Connections:   . Frequency of Communication with Friends and Family: Not on file  . Frequency of Social Gatherings with Friends and Family: Not on file  . Attends Religious Services: Not on file  . Active Member of Clubs or Organizations: Not on file  . Attends Archivist Meetings: Not on file  . Marital Status: Not on file     Family History:  The patient's family history includes Aneurysm in his daughter; Breast cancer in his sister; Cancer in his mother; Colon polyps in his daughter; Diabetes in his brother, daughter, and father; Heart disease in his father; Liver disease in his daughter.   ROS:   Please see the history of present illness.  ROS all other systems are reviewed and are negative   PHYSICAL EXAM:   VS:  BP 104/78   Pulse 70   Ht 5\' 8"  (1.727 m)   Wt 136 lb 9.6 oz (62 kg)   BMI 20.77 kg/m       General: Alert, oriented x3, no distress, lean and appears younger than his stated age Head: no evidence of trauma, PERRL, EOMI, no exophtalmos or lid lag, no myxedema, no xanthelasma; normal ears, nose and oropharynx Neck: normal jugular venous pulsations and no hepatojugular reflux; brisk carotid pulses without delay and no carotid bruits Chest: clear to auscultation, no signs of consolidation by percussion or palpation, normal fremitus, symmetrical and full respiratory excursions Cardiovascular: normal position and quality of the apical impulse, regular rhythm, normal first and second heart sounds, late systolic murmur at the apex, no diastolic murmurs, rubs or gallops Abdomen: no tenderness or distention, no masses by palpation, no abnormal pulsatility or arterial bruits, normal bowel sounds, no hepatosplenomegaly Extremities: no clubbing, cyanosis or edema; 2+ radial, ulnar and brachial pulses bilaterally; 2+ right femoral, posterior tibial and dorsalis  pedis pulses; 2+ left femoral, posterior tibial and dorsalis pedis pulses; no subclavian or femoral bruits Neurological: grossly nonfocal Psych: Normal mood and affect    Wt Readings from Last 3 Encounters:  11/28/19 136 lb 9.6 oz (62 kg)  11/06/18 134 lb (60.8 kg)  06/23/17 138 lb 3.2 oz (62.7 kg)      Studies/Labs Reviewed:   EKG:  EKG is ordered today.  It shows sinus rhythm with a single PAC, left ventricular hypertrophy with slight QRS broadening at 116 ms, no ischemic changes. ASSESSMENT:    1. Coronary artery disease involving native coronary artery of native heart without angina pectoris   2. Essential hypertension   3. Hypercholesterolemia   4. Nonrheumatic mitral (valve) insufficiency   5. Mitral valve prolapse      PLAN:  In order of problems listed above:  1. CAD: He has been off isosorbide for a year without any recurrence of angina. Inferoseptal scar without ischemia by myocardial perfusion study 2011. No need for percutaneous revascularization since 2004. Most recent angiography 2013. He is physically active.  Continue beta-blocker. 2. HTN: Excellent, possibly excessive control.  He has some symptoms of orthostatic hypotension.  Asked him to decrease the dose of irbesartan to 75 mg once daily. 3. HLP:  On a highly active statin.  Get labs from PCP 4. MVP/MR: Asymptomatic mitral insufficiency.  We have not been monitoring with routine echo in view of the patient's age.  I would not exclude him from MitraClip since he has such good functional status, but he is clearly not a candidate for surgical repair.  I think we will continue the current strategy of rechecking an echo only if he develops symptoms.   Medication Adjustments/Labs and Tests Ordered: Current medicines are reviewed at length with the patient today.  Concerns regarding medicines are outlined above.  Medication changes, Labs and Tests ordered today are listed in the Patient Instructions below. Patient  Instructions  Medication Instructions:  DECREASE the Irbesartan to 75 mg once daily  *If you need a refill on your cardiac medications before your next appointment, please call your pharmacy*  Lab Work: None ordered If you have labs (blood work) drawn today and your tests are completely normal, you will receive your results only by: 2014 MyChart Message (if you have MyChart) OR . A paper copy in the mail If you have  any lab test that is abnormal or we need to change your treatment, we will call you to review the results.  Testing/Procedures: None ordered  Follow-Up: At [facility], you and your health needs are our priority.  As part of our continuing mission to provide you with exceptional heart care, we have created designated Provider Care Teams.  These Care Teams include your primary Cardiologist (physician) and Advanced Practice Providers (APPs -  Physician Assistants and Nurse Practitioners) who all work together to provide you with the care you need, when you need it.  Your next appointment:   12 month(s)  The format for your next appointment:   Either In Person or Virtual  Provider:   Thurmon Fair, MD       Signed, Thurmon Fair, MD  11/29/2019 5:14 PM    [facility] [address] [place], [place], [place]  [address] Phone: [phone]; Fax: [phone]

## 2019-11-29 ENCOUNTER — Encounter: Payer: Self-pay | Admitting: Cardiovascular Disease

## 2019-12-23 ENCOUNTER — Other Ambulatory Visit: Payer: Self-pay | Admitting: Cardiovascular Disease

## 2021-01-06 ENCOUNTER — Encounter: Payer: Self-pay | Admitting: Student

## 2021-01-06 ENCOUNTER — Ambulatory Visit (INDEPENDENT_AMBULATORY_CARE_PROVIDER_SITE_OTHER): Payer: Medicare Other | Admitting: Student

## 2021-01-06 ENCOUNTER — Other Ambulatory Visit: Payer: Self-pay

## 2021-01-06 VITALS — BP 138/70 | HR 66 | Ht 68.0 in | Wt 129.2 lb

## 2021-01-06 DIAGNOSIS — I10 Essential (primary) hypertension: Secondary | ICD-10-CM | POA: Diagnosis not present

## 2021-01-06 DIAGNOSIS — E785 Hyperlipidemia, unspecified: Secondary | ICD-10-CM | POA: Diagnosis not present

## 2021-01-06 DIAGNOSIS — I340 Nonrheumatic mitral (valve) insufficiency: Secondary | ICD-10-CM

## 2021-01-06 DIAGNOSIS — I2510 Atherosclerotic heart disease of native coronary artery without angina pectoris: Secondary | ICD-10-CM

## 2021-01-06 MED ORDER — ATORVASTATIN CALCIUM 20 MG PO TABS: 20.0000 mg | ORAL_TABLET | Freq: Every day | ORAL | 3 refills | Status: DC

## 2021-01-06 MED ORDER — NITROGLYCERIN 0.4 MG SL SUBL: 0.4000 mg | SUBLINGUAL_TABLET | SUBLINGUAL | 3 refills | Status: AC | PRN

## 2021-01-06 MED ORDER — HYDRALAZINE HCL 50 MG PO TABS: 50.0000 mg | ORAL_TABLET | Freq: Two times a day (BID) | ORAL | 3 refills | Status: DC

## 2021-01-06 MED ORDER — PANTOPRAZOLE SODIUM 40 MG PO TBEC: 40.0000 mg | DELAYED_RELEASE_TABLET | Freq: Every day | ORAL | 3 refills | Status: DC

## 2021-01-06 MED ORDER — METOPROLOL TARTRATE 25 MG PO TABS: 12.5000 mg | ORAL_TABLET | Freq: Two times a day (BID) | ORAL | 3 refills | Status: DC

## 2021-01-06 MED ORDER — HYDRALAZINE HCL 25 MG PO TABS: 50.0000 mg | ORAL_TABLET | Freq: Two times a day (BID) | ORAL | Status: DC

## 2021-01-06 NOTE — Patient Instructions (Signed)
Medication Instructions:  Your physician recommends that you continue on your current medications as directed. Please refer to the Current Medication list given to you today.  *If you need a refill on your cardiac medications before your next appointment, please call your pharmacy*   Lab Work: None  If you have labs (blood work) drawn today and your tests are completely normal, you will receive your results only by: Marland Kitchen MyChart Message (if you have MyChart) OR . A paper copy in the mail If you have any lab test that is abnormal or we need to change your treatment, we will call you to review the results.   Testing/Procedures: None   Follow-Up: At [facility], you and your health needs are our priority.  As part of our continuing mission to provide you with exceptional heart care, we have created designated Provider Care Teams.  These Care Teams include your primary Cardiologist (physician) and Advanced Practice Providers (APPs -  Physician Assistants and Nurse Practitioners) who all work together to provide you with the care you need, when you need it.  We recommend signing up for the patient portal called "MyChart".  Sign up information is provided on this After Visit Summary.  MyChart is used to connect with patients for Virtual Visits (Telemedicine).  Patients are able to view lab/test results, encounter notes, upcoming appointments, etc.  Non-urgent messages can be sent to your provider as well.   To learn more about what you can do with MyChart, go to [email or web address].    Your next appointment:   12 month(s)  The format for your next appointment:   In Person  Provider:   Dr. Royann Shivers- NL B.Iran Ouch, PA-C- [place]   Other Instructions None

## 2021-01-06 NOTE — Progress Notes (Signed)
Cardiology Office Note    Date:  01/06/2021   ID:  Devon Solis, DOB Apr 28, 1923, MRN [id number]  PCP:  Patient, No Pcp Per  Cardiologist: Thurmon Fair, MD    Chief Complaint  Patient presents with  . Follow-up    Annual Visit    History of Present Illness:    Devon Solis is a 87 y.o. male with past medical history of CAD (s/p prior stents to RCA and LCx with cath in 09/2012 showing D1 stenosis and medical management recommended), mitral valve prolapse, HTN and HLD who presents to the office today for annual follow-up.  He was last examined by Dr. Royann Shivers in 11/2019 and reported walking over a mile each day in his apartment complex and denied any associated chest pain or dyspnea on exertion. Was previously on Imdur but this had been discontinued given no recent anginal symptoms. He did report some dizziness concerning for orthostasis and it was recommended he decrease Irbesartan to 75 mg daily. He had not undergone routine echocardiogram imaging of his mitral valve given his age and it was recommended to consider rechecking an echocardiogram in the future only if he developed symptoms.  In talking with the patient and his daughter today, she reports that he lives in an apartment by himself but she typically helps with most meals and his medications. He has started to experience some memory loss. He still walks for over a mile each day without any chest pain or dyspnea on exertion. No reported palpitations, orthopnea, PND or lower extremity edema. He has not experienced any recent falls and denies any dizziness or presyncope. Irbesartan was discontinued by his PCP in the interim due to soft BP (his PCP has since retired this year).    Past Medical History:  Diagnosis Date  . Abdominal aortic aneurysm (HCC)    small, last ulrasound 04/10/12,   . Arthritis   . Atrial fibrillation (HCC)   . Bleeding stomach ulcer   . CAD (coronary artery disease) 09/24/2012   extensive stents/PCI to RCA  and LCX, last cath 09/2012- high moderate sized 1st diag. with 80% stenosis. no nucs since cath  . CHF (congestive heart failure) (HCC)   . DVT (deep venous thrombosis) (HCC)   . Dyslipidemia 09/24/2012  . Esophageal stricture   . GERD (gastroesophageal reflux disease) 09/24/2012  . Hiatal hernia   . History of gallstones   . HTN (hypertension) 09/24/2012  . Mild aortic insufficiency    last echo 04/2012,  EF 50-55%, moderate MR, mild MVP  . PVCs (premature ventricular contractions) 04/17/2015    Past Surgical History:  Procedure Laterality Date  . APPENDECTOMY    . CARDIAC CATHETERIZATION  02/10/06   Patent stents residual LAD disease  . CARDIAC CATHETERIZATION  09/24/2012   patent stents, 80% 1st diag branch though not felt responsible for symptoms.  . CHOLECYSTECTOMY    . CORONARY ANGIOPLASTY  05/2003   3 cypher stents placed urgently for inf. MI to -RCA, 2 cypher stents -OM1 & 1 cypher stent to LCX  . ESOPHAGOGASTRODUODENOSCOPY (EGD) WITH ESOPHAGEAL DILATION  09/26/2012   Procedure: ESOPHAGOGASTRODUODENOSCOPY (EGD) WITH ESOPHAGEAL DILATION;  Surgeon: Beverley Fiedler, MD;  Location: [facility] ENDOSCOPY;  Service: Gastroenterology;  Laterality: N/A;  . LEFT HEART CATH N/A 09/24/2012   Procedure: LEFT HEART CATH;  Surgeon: Runell Gess, MD;  Location: [facility] CATH LAB;  Service: Cardiovascular;  Laterality: N/A;  . NASAL SINUS SURGERY      Current Medications: Outpatient Medications Prior  to Visit  Medication Sig Dispense Refill  . Ascorbic Acid (VITAMIN C) 1000 MG tablet Take 1,000 mg by mouth daily.    Marland Kitchen aspirin EC 81 MG tablet Take 1 tablet (81 mg total) by mouth daily.    . Multiple Vitamin (MULTIVITAMIN WITH MINERALS) TABS Take 1 tablet by mouth daily.    Marland Kitchen atorvastatin (LIPITOR) 20 MG tablet Take 1 tablet (20 mg total) by mouth at bedtime. 90 tablet 3  . HYDRALAZINE-HCTZ PO Take 50 mg by mouth 2 (two) times daily.    . metoprolol tartrate (LOPRESSOR) 25 MG tablet Take 0.5 tablets (12.5  mg total) by mouth 2 (two) times daily. 90 tablet 3  . nitroGLYCERIN (NITROSTAT) 0.4 MG SL tablet Place 1 tablet (0.4 mg total) under the tongue every 5 (five) minutes as needed. 25 tablet 3  . pantoprazole (PROTONIX) 40 MG tablet Take 1 tablet (40 mg total) by mouth daily. 90 tablet 3  . irbesartan (AVAPRO) 75 MG tablet Take 1 tablet (75 mg total) by mouth daily. 90 tablet 3   No facility-administered medications prior to visit.     Allergies:   Penicillins   Social History   Socioeconomic History  . Marital status: Widowed    Spouse name: Not on file  . Number of children: 4  . Years of education: Not on file  . Highest education level: Not on file  Occupational History  . Occupation: retired  Tobacco Use  . Smoking status: Former Smoker    Types: Cigarettes    Quit date: 08/18/1965    Years since quitting: 55.4  . Smokeless tobacco: Never Used  Vaping Use  . Vaping Use: Never used  Substance and Sexual Activity  . Alcohol use: No  . Drug use: No  . Sexual activity: Not on file  Other Topics Concern  . Not on file  Social History Narrative  . Not on file   Social Determinants of Health   Financial Resource Strain: Not on file  Food Insecurity: Not on file  Transportation Needs: Not on file  Physical Activity: Not on file  Stress: Not on file  Social Connections: Not on file     Family History:  The patient's family history includes Aneurysm in his daughter; Breast cancer in his sister; Cancer in his mother; Colon polyps in his daughter; Diabetes in his brother, daughter, and father; Heart disease in his father; Liver disease in his daughter.   Review of Systems:   Please see the history of present illness.     General:  No chills, fever, night sweats or weight changes.  Cardiovascular:  No chest pain, dyspnea on exertion, edema, orthopnea, palpitations, paroxysmal nocturnal dyspnea. Dermatological: No rash, lesions/masses Respiratory: No cough,  dyspnea Urologic: No hematuria, dysuria Abdominal:   No nausea, vomiting, diarrhea, bright red blood per rectum, melena, or hematemesis Neurologic:  No visual changes, wkns, changes in mental status. All other systems reviewed and are otherwise negative except as noted above.   Physical Exam:    VS:  BP 138/70   Pulse 66   Ht 5\' 8"  (1.727 m)   Wt 129 lb 3.2 oz (58.6 kg)   SpO2 96%   BMI 19.64 kg/m    General: Well developed, well nourished,male appearing in no acute distress. Appears younger than stated age.  Head: Normocephalic, atraumatic. Neck: No carotid bruits. JVD not elevated.  Lungs: Respirations regular and unlabored, without wheezes or rales.  Heart: Regular rate and rhythm. No S3 or  S4.  2/6 systolic murmur along Apex.  Abdomen: Appears non-distended. No obvious abdominal masses. Msk:  Strength and tone appear normal for age. No obvious joint deformities or effusions. Extremities: No clubbing or cyanosis. No edema.  Distal pedal pulses are 2+ bilaterally. Neuro: Alert and oriented X 3. Moves all extremities spontaneously. No focal deficits noted. Psych:  Responds to questions appropriately with a normal affect. Skin: No rashes or lesions noted  Wt Readings from Last 3 Encounters:  01/06/21 129 lb 3.2 oz (58.6 kg)  11/28/19 136 lb 9.6 oz (62 kg)  11/06/18 134 lb (60.8 kg)     Studies/Labs Reviewed:   EKG:  EKG is ordered today.  The EKG ordered today demonstrates NSR, HR 65 with PAC's and isolated TWI along Lead III. No acute ST changes.   Recent Labs: No results found for requested labs within last 8760 hours.   Lipid Panel No results found for: CHOL, TRIG, HDL, CHOLHDL, VLDL, LDLCALC, LDLDIRECT  Additional studies/ records that were reviewed today include:   Echocardiogram: 04/2012   Cardiac Catheterization: 09/2012   AAA US: 03/2016 Normal and stable caliber abdominal aorta, common and external iliac arteries, without focal stenosis, or  dilatation. Aorto-iliac atherosclerosis. Patent IVC.  Assessment:    1. Coronary artery disease involving native coronary artery of native heart without angina pectoris   2. Essential hypertension   3. Hyperlipidemia LDL goal <70   4. Nonrheumatic mitral (valve) insufficiency      Plan:   In order of problems listed above:  1. CAD - He has a history of prior stents to RCA and LCx and most recent catheterization in 09/2012 showed D1 stenosis and medical management was recommended. - He remains active at baseline for his age as he walks over a mile each day and denies any anginal symptoms with this. Conservative management has been pursued given his advanced age. Will continue medical therapy with ASA 81 mg daily, Atorvastatin 20 mg daily and Lopressor 12.5 mg twice daily.  2. HTN - BP is well controlled at 138/70 during today's visit. Continue current medication regimen with Lopressor 12.5 mg twice daily and Hydralazine 50 mg twice daily.   3. HLD - Will request most recent labs from his PCP.  He remains on Atorvastatin 20 mg daily. If he does not establish with a new PCP within the next year, will plan to obtain a repeat FLP and LFT's at his next visit.   4. Mitral Valve Prolapse/Mitral Regurgitation - He has remained asymptomatic and denies any dyspnea, dizziness or presyncope. As outlined by Dr. Erin Hearingroitoru's prior notes, the plan is to continue with a conservative approach for now given his functional status and to only obtain a repeat echocardiogram if he develops symptoms. I encouraged him to reach out in the interim if he notices any changes prior to his next visit.    Medication Adjustments/Labs and Tests Ordered: Current medicines are reviewed at length with the patient today.  Concerns regarding medicines are outlined above.  Medication changes, Labs and Tests ordered today are listed in the Patient Instructions below. Patient Instructions  Medication Instructions:  Your  physician recommends that you continue on your current medications as directed. Please refer to the Current Medication list given to you today.  *If you need a refill on your cardiac medications before your next appointment, please call your pharmacy*   Lab Work: None  If you have labs (blood work) drawn today and your tests are completely normal, you  will receive your results only by: Marland Kitchen MyChart Message (if you have MyChart) OR . A paper copy in the mail If you have any lab test that is abnormal or we need to change your treatment, we will call you to review the results.   Testing/Procedures: None   Follow-Up: At [facility], you and your health needs are our priority.  As part of our continuing mission to provide you with exceptional heart care, we have created designated Provider Care Teams.  These Care Teams include your primary Cardiologist (physician) and Advanced Practice Providers (APPs -  Physician Assistants and Nurse Practitioners) who all work together to provide you with the care you need, when you need it.  We recommend signing up for the patient portal called "MyChart".  Sign up information is provided on this After Visit Summary.  MyChart is used to connect with patients for Virtual Visits (Telemedicine).  Patients are able to view lab/test results, encounter notes, upcoming appointments, etc.  Non-urgent messages can be sent to your provider as well.   To learn more about what you can do with MyChart, go to [email or web address].    Your next appointment:   12 month(s)  The format for your next appointment:   In Person  Provider:   Dr. Royann Shivers- NL B.Iran Ouch, PA-C- [place]   Other Instructions None       Signed, Ellsworth Lennox, PA-C  01/06/2021 5:04 PM    [facility] [address] [place], [place] [address] Phone: [phone] Fax: [phone]

## 2021-01-06 NOTE — Progress Notes (Signed)
Thanks for f/u. I miss him. He always came with 3 women that doted on him.

## 2021-12-20 ENCOUNTER — Other Ambulatory Visit: Payer: Self-pay | Admitting: Student

## 2022-03-20 ENCOUNTER — Other Ambulatory Visit: Payer: Self-pay | Admitting: Student

## 2022-04-08 NOTE — Progress Notes (Signed)
Cardiology Office Note    Date:  04/09/2022   ID:  Devon Solis, DOB 1923-05-19, MRN [id number]  PCP:  Arnette Norris, FNP  Cardiologist: Thurmon Fair, MD    Chief Complaint  Patient presents with   Follow-up    Annual Visit    History of Present Illness:    Devon Solis is a 88 y.o. male with past medical history of CAD (s/p prior stents to RCA and LCx with cath in 09/2012 showing D1 stenosis and medical management recommended), mitral valve prolapse, HTN and HLD who presents to the office today for annual follow-up.  He was last examined by myself in 12/2020 and reported overall doing well at that time and he was still living by himself but his daughter was helping with meals and with his medications. Conservative management had been pursued for his CAD and mitral valve disease given his advanced age with plans to only pursue repeat imaging if he developed any new symptoms. He was continued on his current cardiac medications including ASA 81 mg daily, Atorvastatin 20 mg daily, Lopressor 12.5 mg twice daily and Hydralazine 50 mg twice daily.  In talking with the patient and his 2 daughters today, they report he has overall been stable over the past year from a cardiac perspective. He denies any recent chest pain or dyspnea on exertion. No recent palpitations, orthopnea, PND or pitting edema. He has started to experience more short-term memory loss and is also having balance issues. He refuses to use a cane or walker at this time. Has been nauseated since this morning but no vomiting, diarrhea or constipation. He was evaluated in the Emergency Dept a few months ago for dehydration and they have been encouraging him to consume more water but he prefers coffee and consumes less than 20 ounces of water daily. He lives in a senior community but family members provide meals and manage his medications.    Past Medical History:  Diagnosis Date   Abdominal aortic aneurysm (HCC)    small, last  ulrasound 04/10/12,    Arthritis    Atrial fibrillation (HCC)    Bleeding stomach ulcer    CAD (coronary artery disease) 09/24/2012   extensive stents/PCI to RCA and LCX, last cath 09/2012- high moderate sized 1st diag. with 80% stenosis. no nucs since cath   CHF (congestive heart failure) (HCC)    DVT (deep venous thrombosis) (HCC)    Dyslipidemia 09/24/2012   Esophageal stricture    GERD (gastroesophageal reflux disease) 09/24/2012   Hiatal hernia    History of gallstones    HTN (hypertension) 09/24/2012   Mild aortic insufficiency    last echo 04/2012,  EF 50-55%, moderate MR, mild MVP   PVCs (premature ventricular contractions) 04/17/2015    Past Surgical History:  Procedure Laterality Date   APPENDECTOMY     CARDIAC CATHETERIZATION  02/10/06   Patent stents residual LAD disease   CARDIAC CATHETERIZATION  09/24/2012   patent stents, 80% 1st diag branch though not felt responsible for symptoms.   CHOLECYSTECTOMY     CORONARY ANGIOPLASTY  05/2003   3 cypher stents placed urgently for inf. MI to -RCA, 2 cypher stents -OM1 & 1 cypher stent to LCX   ESOPHAGOGASTRODUODENOSCOPY (EGD) WITH ESOPHAGEAL DILATION  09/26/2012   Procedure: ESOPHAGOGASTRODUODENOSCOPY (EGD) WITH ESOPHAGEAL DILATION;  Surgeon: Beverley Fiedler, MD;  Location: [facility] ENDOSCOPY;  Service: Gastroenterology;  Laterality: N/A;   LEFT HEART CATH N/A 09/24/2012   Procedure: LEFT HEART CATH;  Surgeon: Runell Gess, MD;  Location: [facility] CATH LAB;  Service: Cardiovascular;  Laterality: N/A;   NASAL SINUS SURGERY      Current Medications: Outpatient Medications Prior to Visit  Medication Sig Dispense Refill   Ascorbic Acid (VITAMIN C) 1000 MG tablet Take 1,000 mg by mouth daily.     aspirin EC 81 MG tablet Take 1 tablet (81 mg total) by mouth daily.     Multiple Vitamin (MULTIVITAMIN WITH MINERALS) TABS Take 1 tablet by mouth daily.     nitroGLYCERIN (NITROSTAT) 0.4 MG SL tablet Place 1 tablet (0.4 mg total) under the tongue  every 5 (five) minutes as needed. 25 tablet 3   pantoprazole (PROTONIX) 40 MG tablet TAKE 1 TABLET DAILY 90 tablet 0   atorvastatin (LIPITOR) 20 MG tablet TAKE 1 TABLET AT BEDTIME 90 tablet 0   hydrALAZINE (APRESOLINE) 50 MG tablet TAKE 1 TABLET IN THE       MORNING AND AT BEDTIME 180 tablet 0   metoprolol tartrate (LOPRESSOR) 25 MG tablet TAKE 1/2 TABLET TWICE A DAY 90 tablet 0   No facility-administered medications prior to visit.     Allergies:   Penicillins   Social History   Socioeconomic History   Marital status: Widowed    Spouse name: Not on file   Number of children: 4   Years of education: Not on file   Highest education level: Not on file  Occupational History   Occupation: retired  Tobacco Use   Smoking status: Former    Types: Cigarettes    Quit date: 08/18/1965    Years since quitting: 56.6   Smokeless tobacco: Never  Vaping Use   Vaping Use: Never used  Substance and Sexual Activity   Alcohol use: No   Drug use: No   Sexual activity: Not on file  Other Topics Concern   Not on file  Social History Narrative   Not on file   Social Determinants of Health   Financial Resource Strain: Not on file  Food Insecurity: Not on file  Transportation Needs: Not on file  Physical Activity: Not on file  Stress: Not on file  Social Connections: Not on file     Family History:  The patient's family history includes Aneurysm in his daughter; Breast cancer in his sister; Cancer in his mother; Colon polyps in his daughter; Diabetes in his brother, daughter, and father; Heart disease in his father; Liver disease in his daughter.   Review of Systems:    Please see the history of present illness.     All other systems reviewed and are otherwise negative except as noted above.   Physical Exam:    VS:  BP 122/72 (BP Location: Left Arm, Patient Position: Sitting, Cuff Size: Normal)   Resp 20   Ht 5\' 6"  (1.676 m)   Wt 131 lb 9.6 oz (59.7 kg)   SpO2 95%   BMI 21.24  kg/m    General: Pleasant elderly male appearing in no acute distress. Head: Normocephalic, atraumatic. Neck: No carotid bruits. JVD not elevated.  Lungs: Respirations regular and unlabored, without wheezes or rales.  Heart: Regular rate and rhythm. No S3 or S4.  2/6 systolic murmur best appreciated along the apex.  Abdomen: Appears non-distended. No obvious abdominal masses and no abdominal temderness. Bowel sounds present.  Msk:  Strength and tone appear normal for age. No obvious joint deformities or effusions. Extremities: No clubbing or cyanosis. No pitting edema.  Distal pedal pulses are  2+ bilaterally. Neuro: Alert and oriented X 3. Moves all extremities spontaneously. No focal deficits noted. Psych:  Responds to questions appropriately with a normal affect. Skin: No rashes or lesions noted  Wt Readings from Last 3 Encounters:  04/09/22 131 lb 9.6 oz (59.7 kg)  01/06/21 129 lb 3.2 oz (58.6 kg)  11/28/19 136 lb 9.6 oz (62 kg)     Studies/Labs Reviewed:   EKG:  EKG is ordered today.  The ekg ordered today demonstrates normal sinus rhythm, heart rate 96 with LVH and artifact (patient was moving while this was being obtained due to his nausea). No acute ST changes.   Recent Labs: No results found for requested labs within last 365 days.   Lipid Panel No results found for: "CHOL", "TRIG", "HDL", "CHOLHDL", "VLDL", "LDLCALC", "LDLDIRECT"  Additional studies/ records that were reviewed today include:   Cardiac Catheterization: 09/2012  AAA [place]: 03/2016 Normal and stable caliber abdominal aorta, common and external iliac arteries, without focal stenosis, or dilatation. Aorto-iliac atherosclerosis. Patent IVC.  Assessment:    1. Coronary artery disease involving native coronary artery of native heart without angina pectoris   2. Nonrheumatic mitral (valve) insufficiency   3. Essential hypertension   4. Hyperlipidemia LDL goal <70   5. Nausea      Plan:   In order of  problems listed above:  1. CAD - He underwent stenting to the RCA and LCx in 09/2012 and cath showed residual D1 stenosis and medical management was recommended. He denies any recent anginal symptoms. Conservative management has been pursued since in the setting of his advanced age. - He remains on ASA 81 mg daily, Lopressor 12.5mg  BID and Atorvastatin 20 mg daily.  2. Mitral Valve Prolapse - Prior echocardiogram in 2013 showed mild prolapse and moderate mitral valve regurgitation. He denies any recent dyspnea, dizziness or presyncope.  - Conservative management has been pursued by his primary cardiologist in the setting of his advanced age with plans to only repeat an echocardiogram if he develops any new symptoms. Family is in agreement with conservative management given his age.   3. HTN - BP is well-controlled at 122/72 during today's visit and has been well-controlled when checked by his PCP. Will request a copy of most recent labs from his PCP but creatinine was stable at 1.3 in 11/2021. Continue current medical therapy with Hydralazine 50 mg twice daily and Lopressor 12.5 mg twice daily.  4. HLD - Followed by his PCP. Will request most recent labs. Continue Atorvastatin 20 mg daily  5. Nausea - Unclear etiology as symptoms started this morning. No associated vomiting, diarrhea or constipation. Bowel sounds present on examination with no abdominal tenderness. Recommend follow-up with PCP if symptoms do not improve.    Medication Adjustments/Labs and Tests Ordered: Current medicines are reviewed at length with the patient today.  Concerns regarding medicines are outlined above.  Medication changes, Labs and Tests ordered today are listed in the Patient Instructions below. Patient Instructions  Medication Instructions:  Your physician recommends that you continue on your current medications as directed. Please refer to the Current Medication list given to you today.  *If you need a  refill on your cardiac medications before your next appointment, please call your pharmacy*   Lab Work: NONE ordered at this time of appointment   If you have labs (blood work) drawn today and your tests are completely normal, you will receive your results only by: MyChart Message (if you have MyChart) OR A  paper copy in the mail If you have any lab test that is abnormal or we need to change your treatment, we will call you to review the results.   Testing/Procedures: NONE ordered at this time of appointment     Follow-Up: At [facility], you and your health needs are our priority.  As part of our continuing mission to provide you with exceptional heart care, we have created designated Provider Care Teams.  These Care Teams include your primary Cardiologist (physician) and Advanced Practice Providers (APPs -  Physician Assistants and Nurse Practitioners) who all work together to provide you with the care you need, when you need it.  We recommend signing up for the patient portal called "MyChart".  Sign up information is provided on this After Visit Summary.  MyChart is used to connect with patients for Virtual Visits (Telemedicine).  Patients are able to view lab/test results, encounter notes, upcoming appointments, etc.  Non-urgent messages can be sent to your provider as well.   To learn more about what you can do with MyChart, go to [email or web address].    Your next appointment:   1 year(s)  The format for your next appointment:   In Person  Provider:   You may see Thurmon FairMihai Croitoru, MD or one of the following Advanced Practice Providers on your designated Care Team:   Randall AnBrittany Jaidence Geisler, PA-C    If primary card or EP is not listed click here to update    :1}    Other Instructions WE ARE REQUESTING RECORDS FROM [facility]  Important Information About Sugar         Signed, Ellsworth LennoxBrittany M Neco Kling, PA-C  04/09/2022 4:40 PM    [facility] [address] [place], [place] [address] Phone: [phone] Fax: [phone]

## 2022-04-09 ENCOUNTER — Ambulatory Visit (INDEPENDENT_AMBULATORY_CARE_PROVIDER_SITE_OTHER): Payer: Medicare Other | Admitting: Student

## 2022-04-09 ENCOUNTER — Encounter: Payer: Self-pay | Admitting: Student

## 2022-04-09 ENCOUNTER — Telehealth: Payer: Self-pay

## 2022-04-09 VITALS — BP 122/72 | Resp 20 | Ht 66.0 in | Wt 131.6 lb

## 2022-04-09 DIAGNOSIS — R110 Nausea: Secondary | ICD-10-CM

## 2022-04-09 DIAGNOSIS — I10 Essential (primary) hypertension: Secondary | ICD-10-CM | POA: Diagnosis not present

## 2022-04-09 DIAGNOSIS — I2510 Atherosclerotic heart disease of native coronary artery without angina pectoris: Secondary | ICD-10-CM

## 2022-04-09 DIAGNOSIS — I340 Nonrheumatic mitral (valve) insufficiency: Secondary | ICD-10-CM

## 2022-04-09 DIAGNOSIS — E785 Hyperlipidemia, unspecified: Secondary | ICD-10-CM

## 2022-04-09 MED ORDER — METOPROLOL TARTRATE 25 MG PO TABS: 12.5000 mg | ORAL_TABLET | Freq: Two times a day (BID) | ORAL | 3 refills | Status: DC

## 2022-04-09 MED ORDER — HYDRALAZINE HCL 50 MG PO TABS: 50.0000 mg | ORAL_TABLET | Freq: Two times a day (BID) | ORAL | 3 refills | Status: DC

## 2022-04-09 MED ORDER — ATORVASTATIN CALCIUM 20 MG PO TABS: 20.0000 mg | ORAL_TABLET | Freq: Every day | ORAL | 3 refills | Status: DC

## 2022-04-09 NOTE — Telephone Encounter (Signed)
Pt was seen in office today by Randall An PA-C. Randall An PA-C would like a copy of pts Emergency room visit with [facility] in [place]. Lmom requests pts labs/notes from that visit. I asked them to fax notes to [place] PA-C [phone] or call with any questions or concerns at [phone].

## 2022-04-09 NOTE — Patient Instructions (Addendum)
Medication Instructions:  Your physician recommends that you continue on your current medications as directed. Please refer to the Current Medication list given to you today.  *If you need a refill on your cardiac medications before your next appointment, please call your pharmacy*   Lab Work: NONE ordered at this time of appointment   If you have labs (blood work) drawn today and your tests are completely normal, you will receive your results only by: [place] (if you have MyChart) OR A paper copy in the mail If you have any lab test that is abnormal or we need to change your treatment, we will call you to review the results.   Testing/Procedures: NONE ordered at this time of appointment     Follow-Up: At [facility], you and your health needs are our priority.  As part of our continuing mission to provide you with exceptional heart care, we have created designated Provider Care Teams.  These Care Teams include your primary Cardiologist (physician) and Advanced Practice Providers (APPs -  Physician Assistants and Nurse Practitioners) who all work together to provide you with the care you need, when you need it.  We recommend signing up for the patient portal called "MyChart".  Sign up information is provided on this After Visit Summary.  MyChart is used to connect with patients for Virtual Visits (Telemedicine).  Patients are able to view lab/test results, encounter notes, upcoming appointments, etc.  Non-urgent messages can be sent to your provider as well.   To learn more about what you can do with MyChart, go to [email or web address].    Your next appointment:   1 year(s)  The format for your next appointment:   In Person  Provider:   You may see Sanda Klein, MD or one of the following Advanced Practice Providers on your designated Care Team:   Bernerd Pho, PA-C    If primary card or EP is not listed click here to update    :1}    Other Instructions WE  ARE REQUESTING RECORDS FROM [place]

## 2022-06-11 ENCOUNTER — Other Ambulatory Visit: Payer: Self-pay | Admitting: Student

## 2022-06-17 ENCOUNTER — Telehealth: Payer: Self-pay

## 2022-06-17 MED ORDER — PANTOPRAZOLE SODIUM 40 MG PO TBEC: 40.0000 mg | DELAYED_RELEASE_TABLET | Freq: Every day | ORAL | 2 refills | Status: DC

## 2022-06-17 NOTE — Telephone Encounter (Signed)
Medication refill request for pantoprazole 40 mg tablets approved and sent to pharmacy.

## 2023-03-18 ENCOUNTER — Other Ambulatory Visit: Payer: Self-pay | Admitting: Cardiovascular Disease

## 2023-05-10 ENCOUNTER — Ambulatory Visit: Payer: Medicare Other | Attending: Student | Admitting: Student

## 2023-05-10 ENCOUNTER — Encounter: Payer: Self-pay | Admitting: Student

## 2023-05-10 VITALS — BP 118/60 | HR 64 | Ht 67.0 in | Wt 121.8 lb

## 2023-05-10 DIAGNOSIS — I340 Nonrheumatic mitral (valve) insufficiency: Secondary | ICD-10-CM

## 2023-05-10 DIAGNOSIS — I2510 Atherosclerotic heart disease of native coronary artery without angina pectoris: Secondary | ICD-10-CM | POA: Diagnosis present

## 2023-05-10 DIAGNOSIS — I10 Essential (primary) hypertension: Secondary | ICD-10-CM

## 2023-05-10 DIAGNOSIS — E785 Hyperlipidemia, unspecified: Secondary | ICD-10-CM | POA: Diagnosis present

## 2023-05-10 MED ORDER — HYDRALAZINE HCL 50 MG PO TABS: 50.0000 mg | ORAL_TABLET | Freq: Two times a day (BID) | ORAL | 3 refills | Status: DC

## 2023-05-10 MED ORDER — METOPROLOL TARTRATE 25 MG PO TABS: 12.5000 mg | ORAL_TABLET | Freq: Two times a day (BID) | ORAL | 3 refills | Status: DC

## 2023-05-10 MED ORDER — ATORVASTATIN CALCIUM 20 MG PO TABS: 20.0000 mg | ORAL_TABLET | Freq: Every day | ORAL | 3 refills | Status: DC

## 2023-05-10 MED ORDER — PANTOPRAZOLE SODIUM 40 MG PO TBEC: 40.0000 mg | DELAYED_RELEASE_TABLET | Freq: Every day | ORAL | 3 refills | Status: DC

## 2023-05-10 NOTE — Progress Notes (Signed)
Cardiology Office Note    Date:  05/10/2023  ID:  Devon Solis, DOB Mar 26, 1923, MRN [id number] Cardiologist: Thurmon Fair, MD    History of Present Illness:    Devon Solis is a 88 y.o. male with past medical history of CAD (s/p prior stents to RCA and LCx in 2004 with cath in 09/2012 showing patent stents and D1 stenosis and medical management recommended), mitral valve prolapse, HTN and HLD who presents to the office today for annual follow-up.   He was last examined by myself in 04/2022 and denied any recent anginal symptoms at that time. Had recently been evaluated in the ED for dehydration and family members were encouraging more fluid consumption. No changes were made to his cardiac medications at that time and conservative management had previously been recommended for his CAD and MVP given his advanced age.   In talking with the patient and two of his daughters today, he reports things have overall been stable since his last office visit. He denies any recent chest pain, palpitations or dyspnea on exertion. No recent orthopnea, PND or pitting edema. He has experienced occasional falls and they sound mechanical in nature as they typically occur when he is getting out of his recliner and he does not put the foot rest down. His daughters check on him frequently and he also has caregivers who help with meals but he still lives by himself. He likes to go out to eat at restaurants with family members.   Studies Reviewed:   EKG: EKG is ordered today and demonstrates:    EKG Interpretation Date/Time:  Tuesday May 10 2023 13:25:14 EDT Ventricular Rate:  64 PR Interval:  160 QRS Duration:  106 QT Interval:  446 QTC Calculation: 460 R Axis:   -31  Text Interpretation: Sinus rhythm with Premature atrial complexes Left axis deviation Minimal voltage criteria for LVH, may be normal variant ( R in aVL ) Confirmed by Randall An ([id number]) on 05/10/2023 1:28:26 PM        Echocardiogram:  04/2012    AAA duplex: 03/2016    Physical Exam:   VS:  BP 118/60   Pulse 64   Ht 5\' 7"  (1.702 m)   Wt 121 lb 12.8 oz (55.2 kg)   SpO2 94%   BMI 19.08 kg/m    Wt Readings from Last 3 Encounters:  05/10/23 121 lb 12.8 oz (55.2 kg)  04/09/22 131 lb 9.6 oz (59.7 kg)  01/06/21 129 lb 3.2 oz (58.6 kg)     GEN: Pleasant, elderly male appearing in no acute distress NECK: No JVD; No carotid bruits CARDIAC: RRR, 2/6 systolic murmur along Apex.  RESPIRATORY:  Clear to auscultation without rales, wheezing or rhonchi  ABDOMEN: Appears non-distended. No obvious abdominal masses. EXTREMITIES: No clubbing or cyanosis. No pitting edema.  Distal pedal pulses are 2+ bilaterally.   Assessment and Plan:   1. CAD - He previously underwent stenting to the RCA and LCx in 2004 with patent stents by cath in 2013 with medical management recommended. He denies any recent anginal symptoms and surveillance testing has not been arranged given his advanced age. I encouraged him to make [place] aware of any new symptoms in the interim. - Continue current medical therapy with ASA 81 mg daily, Atorvastatin 20 mg daily and Lopressor 12.5 mg twice daily.  2. Mitral Valve Prolapse/Mitral Regurgitation - Prior echocardiogram in 2013 did show mild mitral valve prolapse and moderate mitral valve regurgitation.  Conservative management had previously been  pursued by review of notes given his advanced age. Was only recommended to consider repeat echocardiogram if he developed any new symptoms and at this time he continues to do well without symptoms. His falls as described above have been mechanical and he denies any associated dizziness or presyncope.  3. HTN - His blood pressure is well-controlled at 118/60 during today's visit. Continue current medical therapy with Hydralazine 50 mg twice daily and Lopressor 12.5 mg twice daily. We will request a copy of most recent labs from his PCP.   4. HLD - Followed by his PCP.   We will request a copy of most recent labs. He remains on Atorvastatin 20 mg daily.   Signed, Ellsworth Lennox, PA-C

## 2023-05-10 NOTE — Patient Instructions (Signed)
Medication Instructions:  Your physician recommends that you continue on your current medications as directed. Please refer to the Current Medication list given to you today.  *If you need a refill on your cardiac medications before your next appointment, please call your pharmacy*   Lab Work: Your physician recommends that you return for lab work in: [place]   If you have labs (blood work) drawn today and your tests are completely normal, you will receive your results only by: MyChart Message (if you have MyChart) OR A paper copy in the mail If you have any lab test that is abnormal or we need to change your treatment, we will call you to review the results.   Testing/Procedures: NONE    Follow-Up: At [facility], you and your health needs are our priority.  As part of our continuing mission to provide you with exceptional heart care, we have created designated Provider Care Teams.  These Care Teams include your primary Cardiologist (physician) and Advanced Practice Providers (APPs -  Physician Assistants and Nurse Practitioners) who all work together to provide you with the care you need, when you need it.  We recommend signing up for the patient portal called "MyChart".  Sign up information is provided on this After Visit Summary.  MyChart is used to connect with patients for Virtual Visits (Telemedicine).  Patients are able to view lab/test results, encounter notes, upcoming appointments, etc.  Non-urgent messages can be sent to your provider as well.   To learn more about what you can do with MyChart, go to [email or web address].    Your next appointment:   1 year(s)  Provider:   You may see Thurmon Fair, MD or one of the following Advanced Practice Providers on your designated Care Team:   Randall An, PA-C  Jacolyn Reedy, [place]     Other Instructions Thank you for choosing [facility]!

## 2023-12-28 ENCOUNTER — Telehealth: Payer: Self-pay | Admitting: Student

## 2023-12-28 NOTE — Telephone Encounter (Signed)
 Chrissie Noa, NP called in asking to speak with whoever is covering for [place], [place] about some medication and low HR issues at your earliest convenience.   Phone: [phone]

## 2023-12-28 NOTE — Telephone Encounter (Addendum)
 Covering for B [place]rad[place] today. Spoke with Gayla Doss NP who saw patient today. They report patient has had some orthostatic type dizziness recently and falling. This is not new for patient but she was concerned about VS variability today.  Initial HR on intake was 40. Initial BP 96/67 then recheck 146/62. Orthostatics showed HR rise to 100, BP 140s/60s. By the time they got EKG he was in NSR with occasional PACs/PVCs with HR 90s (not corroborated with pulse ox at that time).  Difficult to know what this represents, cannot exclude a component of bradysphygmia contributing to falsely low HR on intake, but given his age and prior baseline HR 60s, reasonable to trial stopping low dose metoprolol and see how he does. Inetta Fermo will let the patient know. Deatra James in this case would typically recommend basic recheck of labs, she reports they were recently done and stable. Will forward request to our office to get patient in for follow-up visit to reassess VS. Would get orthostatics and EKG at that visit. Thanks.

## 2023-12-29 ENCOUNTER — Ambulatory Visit: Payer: Medicare Other | Attending: Medical | Admitting: Medical

## 2023-12-29 ENCOUNTER — Encounter: Payer: Self-pay | Admitting: Medical

## 2023-12-29 ENCOUNTER — Ambulatory Visit: Payer: Medicare Other

## 2023-12-29 VITALS — BP 143/76 | Ht 67.0 in | Wt 128.6 lb

## 2023-12-29 DIAGNOSIS — I2510 Atherosclerotic heart disease of native coronary artery without angina pectoris: Secondary | ICD-10-CM | POA: Diagnosis present

## 2023-12-29 DIAGNOSIS — I951 Orthostatic hypotension: Secondary | ICD-10-CM | POA: Insufficient documentation

## 2023-12-29 DIAGNOSIS — R001 Bradycardia, unspecified: Secondary | ICD-10-CM

## 2023-12-29 DIAGNOSIS — I340 Nonrheumatic mitral (valve) insufficiency: Secondary | ICD-10-CM | POA: Diagnosis present

## 2023-12-29 DIAGNOSIS — I10 Essential (primary) hypertension: Secondary | ICD-10-CM | POA: Diagnosis present

## 2023-12-29 NOTE — Patient Instructions (Signed)
 Medication Instructions:   Your physician recommends that you continue on your current medications as directed. Please refer to the Current Medication list given to you today.  *If you need a refill on your cardiac medications before your next appointment, please call your pharmacy*   Lab Work: Your physician recommends that you return for lab work in today at [facility]. [address] [place], [place] [address].   If you have labs (blood work) drawn today and your tests are completely normal, you will receive your results only by: MyChart Message (if you have MyChart) OR A paper copy in the mail If you have any lab test that is abnormal or we need to change your treatment, we will call you to review the results.   Testing/Procedures: Christena Deem- Long Term Monitor Instructions   Your physician has requested you wear your ZIO patch monitor___14____days.   This is a single patch monitor.  [employer] supplies one patch monitor per enrollment.  Additional stickers are not available.   Please do not apply patch if you will be having a Nuclear Stress Test, Echocardiogram, Cardiac CT, MRI, or Chest Xray during the time frame you would be wearing the monitor. The patch cannot be worn during these tests.  You cannot remove and re-apply the ZIO XT patch monitor.   Your ZIO patch monitor will be sent USPS Priority mail from [facility] directly to your home address. The monitor may also be mailed to a PO BOX if home delivery is not available.   It may take 3-5 days to receive your monitor after you have been enrolled.   Once you have received you monitor, please review enclosed instructions.  Your monitor has already been registered assigning a specific monitor serial # to you.   Applying the monitor   Shave hair from upper left chest.   Hold abrader disc by orange tab.  Rub abrader in 40 strokes over left upper chest as indicated in your monitor instructions.   Clean area with 4 enclosed  alcohol pads .  Use all pads to assure are is cleaned thoroughly.  Let dry.   Apply patch as indicated in monitor instructions.  Patch will be place under collarbone on left side of chest with arrow pointing upward.   Rub patch adhesive wings for 2 minutes.Remove white label marked "1".  Remove white label marked "2".  Rub patch adhesive wings for 2 additional minutes.   While looking in a mirror, press and release button in center of patch.  A small green light will flash 3-4 times .  This will be your only indicator the monitor has been turned on.     Do not shower for the first 24 hours.  You may shower after the first 24 hours.   Press button if you feel a symptom. You will hear a small click.  Record Date, Time and Symptom in the Patient Log Book.   When you are ready to remove patch, follow instructions on last 2 pages of Patient Log Book.  Stick patch monitor onto last page of Patient Log Book.   Place Patient Log Book in [place] box.  Use locking tab on box and tape box closed securely.  The [employer] and [employer] has [employer] on it.  Please place in mailbox as soon as possible.  Your physician should have your test results approximately 7 days after the monitor has been mailed back to [facility].   Call [facility] Customer Care at  [phone] if you have questions regarding your ZIO XT patch monitor.  Call them immediately if you see an orange light blinking on your monitor.   If your monitor falls off in less than 4 days contact our Monitor department at [phone].  If your monitor becomes loose or falls off after 4 days call [employer] at [phone] for suggestions on securing your monitor.     Follow-Up: At [facility], you and your health needs are our priority.  As part of our continuing mission to provide you with exceptional heart care, we have created designated Provider Care Teams.  These Care Teams include your primary Cardiologist (physician) and  Advanced Practice Providers (APPs -  Physician Assistants and Nurse Practitioners) who all work together to provide you with the care you need, when you need it.  We recommend signing up for the patient portal called "MyChart".  Sign up information is provided on this After Visit Summary.  MyChart is used to connect with patients for Virtual Visits (Telemedicine).  Patients are able to view lab/test results, encounter notes, upcoming appointments, etc.  Non-urgent messages can be sent to your provider as well.   To learn more about what you can do with MyChart, go to [email or web address].    Your next appointment:   4 -6 week(s)  Provider:   You may see Thurmon Fair, MD or one of the following Advanced Practice Providers on your designated Care Team:   Randall An, PA-C  Jacolyn Reedy, [place]     Other Instructions Thank you for choosing [facility]!

## 2023-12-29 NOTE — Progress Notes (Signed)
 Cardiology Office Note:  .   Date:  12/29/2023  ID:  Devon Solis, DOB 1923-04-20, MRN [id number] PCP: Arnette Norris, FNP  [facility] Providers Cardiologist:  Thurmon Fair, MD     History of Present Illness: Devon Solis is a 89 y.o. male CAD status post prior stents to the RCA and left circumflex in 2004 with cath in November 2013 showing patent stents and D1 stenosis and medical management recommended, mitral valve prolapse, hypertension and hyperlipidemia who presents for follow-up.  Patient was last seen in July 2024 and was overall doing well from a cardiac perspective.  Today, the patient presents with his two daughters. Patient is hard of hearing. PCP was concerned with BP and pulse rate so it was recommended he be seen. They reported HR in the 40s when he was laying down, but when he stood up HR went to 100bpm. PCP stopped metoprolol for low HR. Yesterday, he felt dizzy when he stood up. Says legs tingle. He sleeps a lot. Still lives alone, but close to one of his daughter. Patient reports chest pain when he stands up, but can't describe much more than this. No breathing issues or LLE.    Studies Reviewed: Marland Kitchen   EKG Interpretation Date/Time:  Thursday December 29 2023 15:19:23 EST Ventricular Rate:  86 PR Interval:    QRS Duration:  116 QT Interval:  392 QTC Calculation: 469 R Axis:   -30  Text Interpretation: Undetermined rhythm Left axis deviation Left ventricular hypertrophy with QRS widening ( R in aVL , Sokolow-Lyon , Cornell product ) When compared with ECG of 10-May-2023 13:25, Current undetermined rhythm precludes rhythm comparison, needs review Confirmed by Fransico Michael, Keeshia Sanderlin ([id number]) on 12/29/2023 4:02:42 PM          Physical Exam:   VS:  BP (!) 143/76   Ht 5\' 7"  (1.702 m)   Wt 128 lb 9.6 oz (58.3 kg)   SpO2 94%   BMI 20.14 kg/m    Wt Readings from Last 3 Encounters:  12/29/23 128 lb 9.6 oz (58.3 kg)  05/10/23 121 lb 12.8 oz (55.2 kg)  04/09/22 131 lb 9.6  oz (59.7 kg)    GEN: Well nourished, well developed in no acute distress NECK: No JVD; No carotid bruits CARDIAC: RR, frequent ectopy, + murmurs, no rubs, gallops RESPIRATORY:  Clear to auscultation without rales, wheezing or rhonchi  ABDOMEN: Soft, non-tender, non-distended EXTREMITIES:  No edema; No deformity   ASSESSMENT AND PLAN: .    Bradycardia PCP concerned for orthostasis and low HR into the 40s. Orthostatics today show almost positive from standing to standing at 3 minutes and HR does is labile, 81>50>80>44. EKG today shows NSR 86bpm with frequent PACs and PVCs. Unsure if HR is recorded correctly d/t frequent ectopy. Agree with stopping metoprolol for now. Patient reports lightheadedness when he stands up. I will update BMET, Mag and TSH. I will order a 2 week heart monitor.   Orthostasis H/o HTN Orthostatics almost positive as above. Metoprolol was stopped yesterday. He is also on Hydralazine 50mg  BID. I will keep this on for now, may need to decrease Hydralazine if symptoms continue.  CAD Patient previously underwent stenting tot he RCA and Lcx in 2004 with patent stents by caht in 2013 with medical management recommended. He reports vague chest pain when he stands up. May be MSK, daughters will continue to investigate as this was the first time he complained of this. Continue ASA and Lipitor. BB held  as above.   Mitral valve prolapse Echo in 2013 showed mild MVP and moderate MR. He has a murmur on exam. Discussed echo, but will wait at this time.    Dispo: follow-up in 4-6 weeks  Signed, Zania Kalisz David Stall, PA-C

## 2023-12-29 NOTE — Telephone Encounter (Signed)
 Patient scheduled with C. Fransico Michael, PA on 2/27 at 3 pm in the [place] office. Pt will need orthostatics and EKG completed at that visit per D. Dunn, PA.

## 2023-12-29 NOTE — Telephone Encounter (Signed)
 Talked to daughter (DPR) and offered 3 pm appt with C. Fransico Michael, PA today in [place]. She lives in [place], pt lives in [place], so she will call back to confirm appt. Slot held per Almond Lint, CMA.

## 2023-12-30 LAB — BASIC METABOLIC PANEL
BUN/Creatinine Ratio: 16 (ref 10–24)
BUN: 26 mg/dL (ref 10–36)
CO2: 22 mmol/L (ref 20–29)
Calcium: 8.9 mg/dL (ref 8.6–10.2)
Chloride: 107 mmol/L — ABNORMAL HIGH (ref 96–106)
Creatinine, Ser: 1.58 mg/dL — ABNORMAL HIGH (ref 0.76–1.27)
Glucose: 95 mg/dL (ref 70–99)
Potassium: 4.2 mmol/L (ref 3.5–5.2)
Sodium: 145 mmol/L — ABNORMAL HIGH (ref 134–144)
eGFR: 39 mL/min/{1.73_m2} — ABNORMAL LOW (ref >59)

## 2023-12-30 LAB — MAGNESIUM: Magnesium: 1.9 mg/dL (ref 1.6–2.3)

## 2023-12-30 LAB — TSH: TSH: 3.51 u[IU]/mL (ref 0.450–4.500)

## 2024-01-02 ENCOUNTER — Telehealth: Payer: Self-pay | Admitting: Cardiovascular Disease

## 2024-01-02 NOTE — Telephone Encounter (Signed)
 Patient identification verified by 2 forms. Marilynn Rail, RN    Called and spoke to patients daughter Michael Litter states:   -patient had monitor placed on 2/27  -patient has dementia and did not tolerate monitor   -he removed the monitor after 3 days   -due to dementia it is likely he will remove it again Lanney Gins to return monitor to [employer], office will outreach when results available  Alice agrees with plan, no further questions at this time

## 2024-01-02 NOTE — Telephone Encounter (Signed)
 Daughter called to say that the patient has taking th heart monitor off. Calling to see if anything was able to be read from it, the last 3 days. Please advise

## 2024-01-13 ENCOUNTER — Other Ambulatory Visit: Payer: Self-pay

## 2024-01-13 MED ORDER — CARVEDILOL 12.5 MG PO TABS: 12.5000 mg | ORAL_TABLET | Freq: Two times a day (BID) | ORAL | 3 refills | Status: DC

## 2024-01-30 NOTE — Progress Notes (Signed)
 Cardiology Office Note:  .   Date:  02/13/2024  ID:  Devon Solis, DOB 1923/08/21, MRN [id number] PCP: Johna Myers, FNP  [facility] Providers Cardiologist:  Luana Rumple, MD    History of Present Illness: Devon Solis is a 89 y.o. male  with history of CAD status post prior stents to the RCA and left circumflex in 2004 with cath in November 2013 showing patent stents and D1 stenosis and medical management recommended, mitral valve prolapse, hypertension and hyperlipidemia  Patient was seen in office 12/29/23 with low HR's in 40's laying down but up to 100 bpm with standing. PCP stopped his metoprolol. He was almost orthostatic but wanted to monitor off metoprolol. 2 week monitor ordered but only wore for 2 days and pulled it off. . Monitor showed freq PVC's. Hydralazine stopped and low dose metoprolol 12.5 mg bid started. EPS referral but given patient's age, family declined.  Patient here with his 2 daughters. There has been some confusion over his meds and he's never started back on the metoprolol and is still on hydralazine. Daughters say he's feeling so much better. Patient denies dizziness, palpitations, presyncope. He still lives alone with help twice a day. When [place] saw him PCP was concerned for HR's in 40's but suspect he was having PVC's and PAC's. No bradycardia on monitor.  Family prefers to leave off metoprolol at this time since he's doing so much better.  ROS:    Studies Reviewed: Aaron Aas         Prior CV Studies:    Monitor 01/10/24  The dominant rhythm is normal sinus rhythm with normal circadian variation   There are frequent premature atrial contractions representing roughly 20% of all recorded beats and they are relatively frequent episodes of brief nonsustained ectopic atrial tachycardia (maximum 19 beats).  There is no evidence of atrial fibrillation   There are also frequent premature ventricular contractions representing roughly 10% of all recorded beats,  but there is no evidence of ventricular tachycardia.   There are no episodes of severe bradycardia, sinus pauses or high-grade AV block.   No symptom-related recordings were submitted   Abnormal arrhythmia monitor due to very frequent premature atrial contractions (20% of all beats) and brief episodes of ectopic atrial tachycardia (nonsustained) as well as frequent premature ventricular contractions (10% of all beats).   There is no evidence of atrial fibrillation or ventricular tachycardia.     Patch Wear Time:  1 days and 23 hours (2025-02-27T16:08:52-0500 to 2025-03-01T15:20:13-0500)   Patient had a min HR of 55 bpm, max HR of 211 bpm, and avg HR of 81 bpm. Predominant underlying rhythm was Sinus Rhythm. 1 run of Ventricular Tachycardia occurred lasting 5 beats with a max rate of 174 bpm (avg 150 bpm). 254 Supraventricular Tachycardia  runs occurred, the run with the fastest interval lasting 10 beats with a max rate of 211 bpm, the longest lasting 19 beats with an avg rate of 134 bpm. Isolated SVEs were frequent (14.4%, [id number]), SVE Couplets were occasional (4.7%, 5534), and SVE  Triplets were occasional (2.5%, 1969). Isolated VEs were frequent (7.5%, 17683), VE Couplets were occasional (2.0%, 2352), and VE Triplets were rare (<1.0%, 230). Ventricular Bigeminy and Trigeminy were present.     Risk Assessment/Calculations:             Physical Exam:   VS:  BP 136/72   Pulse 72   Ht 5\' 7"  (1.702 m)   Wt 126 lb 9.6  oz (57.4 kg)   SpO2 99%   BMI 19.83 kg/m    Wt Readings from Last 3 Encounters:  02/13/24 126 lb 9.6 oz (57.4 kg)  12/29/23 128 lb 9.6 oz (58.3 kg)  05/10/23 121 lb 12.8 oz (55.2 kg)    GEN: Well nourished, well developed in no acute distress NECK: No JVD; No carotid bruits CARDIAC:  RRR, 2/6 systolic murmur LSB RESPIRATORY:  Clear to auscultation without rales, wheezing or rhonchi  ABDOMEN: Soft, non-tender, non-distended EXTREMITIES:  No edema; No deformity    ASSESSMENT AND PLAN: .    SVT(254 episode), freq PAC's-20% and 10% PVC's while off metoprolol. Restarted at lower dose 12.5 mg bid but they never did and he's feeling so much better according to the duaghters. No further falls, dizziness, palpitations. Will leave off for now and have early f/u.  HTN with orthostatic hypotension. Will reduce hydralazine 25 mg bid.   CAD stent RCA and Cfx 2004, patent on cath 2013.   MVP with mod MR on echo 2013          Dispo: f/u in 4 months in [place].  Signed, Theotis Flake, PA-C

## 2024-02-13 ENCOUNTER — Encounter: Payer: Self-pay | Admitting: Physician Assistant

## 2024-02-13 ENCOUNTER — Ambulatory Visit: Payer: Medicare Other | Attending: Physician Assistant | Admitting: Physician Assistant

## 2024-02-13 VITALS — BP 136/72 | HR 72 | Ht 67.0 in | Wt 126.6 lb

## 2024-02-13 DIAGNOSIS — I10 Essential (primary) hypertension: Secondary | ICD-10-CM | POA: Diagnosis present

## 2024-02-13 DIAGNOSIS — I340 Nonrheumatic mitral (valve) insufficiency: Secondary | ICD-10-CM

## 2024-02-13 DIAGNOSIS — I951 Orthostatic hypotension: Secondary | ICD-10-CM

## 2024-02-13 DIAGNOSIS — I493 Ventricular premature depolarization: Secondary | ICD-10-CM

## 2024-02-13 DIAGNOSIS — I471 Supraventricular tachycardia, unspecified: Secondary | ICD-10-CM

## 2024-02-13 DIAGNOSIS — I2510 Atherosclerotic heart disease of native coronary artery without angina pectoris: Secondary | ICD-10-CM

## 2024-02-13 MED ORDER — HYDRALAZINE HCL 25 MG PO TABS: 25.0000 mg | ORAL_TABLET | Freq: Two times a day (BID) | ORAL | 3 refills | Status: DC

## 2024-02-13 NOTE — Patient Instructions (Signed)
 Medication Instructions:  Your physician has recommended you make the following change in your medication:   -Decrease Hydralazine to 25 mg twice daily   *If you need a refill on your cardiac medications before your next appointment, please call your pharmacy*  Lab Work: None If you have labs (blood work) drawn today and your tests are completely normal, you will receive your results only by: MyChart Message (if you have MyChart) OR A paper copy in the mail If you have any lab test that is abnormal or we need to change your treatment, we will call you to review the results.  Testing/Procedures: None  Follow-Up: At [facility], you and your health needs are our priority.  As part of our continuing mission to provide you with exceptional heart care, our providers are all part of one team.  This team includes your primary Cardiologist (physician) and Advanced Practice Providers or APPs (Physician Assistants and Nurse Practitioners) who all work together to provide you with the care you need, when you need it.  Your next appointment:   4 month(s)  Provider:   You will see one of the following Advanced Practice Providers on your designated Care Team:   [place], PA-C  [place], [place] Theotis Flake, [place]       We recommend signing up for the patient portal called "MyChart".  Sign up information is provided on this After Visit Summary.  MyChart is used to connect with patients for Virtual Visits (Telemedicine).  Patients are able to view lab/test results, encounter notes, upcoming appointments, etc.  Non-urgent messages can be sent to your provider as well.   To learn more about what you can do with MyChart, go to [email or web address].   Other Instructions

## 2024-06-07 ENCOUNTER — Other Ambulatory Visit: Payer: Self-pay | Admitting: Student

## 2024-06-11 ENCOUNTER — Other Ambulatory Visit: Payer: Self-pay | Admitting: Student

## 2024-06-13 NOTE — Progress Notes (Signed)
 Cardiology Office Note    Date:  06/14/2024  ID:  Jadarion Halbig, DOB 1923/01/22, MRN [id number] Cardiologist: Jerel Balding, MD Cardiology APP:  Johnson Laymon HERO, PA-C { : History of Present Illness:    Marc Leichter is a 89 y.o. male  with past medical history of CAD (s/p prior stents to RCA and LCx in 2004 with cath in 09/2012 showing patent stents and D1 stenosis and medical management recommended), mitral valve prolapse, HTN and HLD who presents to the office today for 69-month follow-up.  He was last examined by Rosaline Pavy, PA in 01/2024 and had been evaluated in 12/2023 with heart rate in the 40's at times and Lopressor  was discontinued. A monitor had been ordered but he only wore this for 2 days due to pulling it off but the data available showed frequent PAC's representing 20% of beats and PVC's representing 10% of beats with no severe bradycardia or high-grade AV block. Was recommended to restart Lopressor  12.5 mg twice daily. EP referral was reviewed but family declined given his age. Family members had not restarted Metoprolol  as he reported overall feeling much better off the medication. He was orthostatic and Hydralazine  was reduced to 25 mg twice daily.  In talking with the patient and his 2 daughters today, they report he has overall been doing well since his last visit.  He did have intermittent confusion a few months ago and was diagnosed with a UTI at that time. Had recurrent confusion this past weekend but his daughter encouraged him to consume water and this resolved. They say that he mostly consumes coffee during the day with very little water (typically around 10 ounces). He denies any recent chest pain or dyspnea on exertion. No recent palpitations, orthopnea, PND or pitting edema. He lives in an apartment but has caregivers that check on him twice a day and one of his daughters lives close by. The most active thing he typically does is walk from his apartment to the car. Enjoys  eating at [employer].  Studies Reviewed:   EKG: EKG is ordered today and demonstrates:   EKG Interpretation Date/Time:  Thursday June 14 2024 13:00:15 EDT Ventricular Rate:  91 PR Interval:  160 QRS Duration:  116 QT Interval:  380 QTC Calculation: 467 R Axis:   -44  Text Interpretation: Sinus rhythm with Premature atrial complexes Left axis deviation Left ventricular hypertrophy with QRS widening Confirmed by Johnson Laymon ([id number]) on 06/14/2024 1:02:40 PM       Event Monitor: 12/2023   The dominant rhythm is normal sinus rhythm with normal circadian variation   There are frequent premature atrial contractions representing roughly 20% of all recorded beats and they are relatively frequent episodes of brief nonsustained ectopic atrial tachycardia (maximum 19 beats).  There is no evidence of atrial fibrillation   There are also frequent premature ventricular contractions representing roughly 10% of all recorded beats, but there is no evidence of ventricular tachycardia.   There are no episodes of severe bradycardia, sinus pauses or high-grade AV block.   No symptom-related recordings were submitted   Abnormal arrhythmia monitor due to very frequent premature atrial contractions (20% of all beats) and brief episodes of ectopic atrial tachycardia (nonsustained) as well as frequent premature ventricular contractions (10% of all beats).   There is no evidence of atrial fibrillation or ventricular tachycardia.   Physical Exam:   VS:  BP 130/82 (BP Location: Left Arm, Cuff Size: Normal)   Pulse 89  Ht 5' 7 (1.702 m)   Wt 124 lb 3.2 oz (56.3 kg)   SpO2 96%   BMI 19.45 kg/m    Wt Readings from Last 3 Encounters:  06/14/24 124 lb 3.2 oz (56.3 kg)  02/13/24 126 lb 9.6 oz (57.4 kg)  12/29/23 128 lb 9.6 oz (58.3 kg)     GEN: Pleasant, elderly male appearing in no acute distress NECK: No JVD; No carotid bruits CARDIAC: RRR, 2/6 SEM along Apex.  RESPIRATORY:  Clear to  auscultation without rales, wheezing or rhonchi  ABDOMEN: Appears non-distended. No obvious abdominal masses. EXTREMITIES: No clubbing or cyanosis. No pitting edema.  Distal pedal pulses are 2+ bilaterally.   Assessment and Plan:   1. Coronary artery disease involving native coronary artery of native heart, unspecified whether angina present - He previously underwent stenting to the RCA and LCx in 2004 with catheterization in 2013 showing patent stents and D1 stenosis with medical management recommended.  - While his activity is limited in the setting of his advanced age, he denies any recent anginal symptoms. Continue ASA 81 mg daily and Atorvastatin  10 mg daily.  2. Nonrheumatic mitral (valve) insufficiency - Echocardiogram in 2013 showed mild mitral valve prolapse and moderate MR. Conservative management had been recommended given his advanced age and would only pursue a repeat echocardiogram if he developed new symptoms.  3. PAC's/PVC's - Recent monitor earlier this year as outlined above showed a 20% PAC burden and 10% PVC burden. He denies any recent palpitations and was previously on Metoprolol  but family members report he feels significantly better off of the medication and seems to have more energy. Will remain off AV nodal blocking agents for now.  4. Essential hypertension - BP is well-controlled at 130/82 during today's visit. Continue current medical therapy with Hydralazine  25 mg twice daily.  5. Hyperlipidemia LDL goal <70 - LDL was at 62 in 08/2023 and LFT's WNL. Continue Atorvastatin  10 mg daily.   Signed, Laymon CHRISTELLA Qua, PA-C

## 2024-06-14 ENCOUNTER — Ambulatory Visit: Attending: Student | Admitting: Student

## 2024-06-14 ENCOUNTER — Encounter: Payer: Self-pay | Admitting: Student

## 2024-06-14 VITALS — BP 130/82 | HR 89 | Ht 67.0 in | Wt 124.2 lb

## 2024-06-14 DIAGNOSIS — I491 Atrial premature depolarization: Secondary | ICD-10-CM

## 2024-06-14 DIAGNOSIS — I10 Essential (primary) hypertension: Secondary | ICD-10-CM | POA: Insufficient documentation

## 2024-06-14 DIAGNOSIS — I493 Ventricular premature depolarization: Secondary | ICD-10-CM | POA: Diagnosis present

## 2024-06-14 DIAGNOSIS — E785 Hyperlipidemia, unspecified: Secondary | ICD-10-CM | POA: Insufficient documentation

## 2024-06-14 DIAGNOSIS — I340 Nonrheumatic mitral (valve) insufficiency: Secondary | ICD-10-CM

## 2024-06-14 DIAGNOSIS — I2510 Atherosclerotic heart disease of native coronary artery without angina pectoris: Secondary | ICD-10-CM | POA: Insufficient documentation

## 2024-06-14 MED ORDER — ATORVASTATIN CALCIUM 10 MG PO TABS: 10.0000 mg | ORAL_TABLET | Freq: Every day | ORAL | 3 refills | Status: AC

## 2024-06-14 NOTE — Patient Instructions (Signed)
 Medication Instructions:  Your physician recommends that you continue on your current medications as directed. Please refer to the Current Medication list given to you today.  *If you need a refill on your cardiac medications before your next appointment, please call your pharmacy*  Lab Work: NONE   If you have labs (blood work) drawn today and your tests are completely normal, you will receive your results only by: MyChart Message (if you have MyChart) OR A paper copy in the mail If you have any lab test that is abnormal or we need to change your treatment, we will call you to review the results.  Testing/Procedures: NONE   Follow-Up: At [facility], you and your health needs are our priority.  As part of our continuing mission to provide you with exceptional heart care, our providers are all part of one team.  This team includes your primary Cardiologist (physician) and Advanced Practice Providers or APPs (Physician Assistants and Nurse Practitioners) who all work together to provide you with the care you need, when you need it.  Your next appointment:   1 year(s)  Provider:   Laymon Qua, PA-C    We recommend signing up for the patient portal called "MyChart".  Sign up information is provided on this After Visit Summary.  MyChart is used to connect with patients for Virtual Visits (Telemedicine).  Patients are able to view lab/test results, encounter notes, upcoming appointments, etc.  Non-urgent messages can be sent to your provider as well.   To learn more about what you can do with MyChart, go to [email or web address].   Other Instructions Thank you for choosing [facility]!

## 2024-06-15 ENCOUNTER — Telehealth: Payer: Self-pay | Admitting: Student

## 2024-06-15 MED ORDER — HYDRALAZINE HCL 25 MG PO TABS: 25.0000 mg | ORAL_TABLET | Freq: Two times a day (BID) | ORAL | 3 refills | Status: AC

## 2024-06-15 NOTE — Telephone Encounter (Signed)
*  STAT* If patient is at the pharmacy, call can be transferred to refill team.   1. Which medications need to be refilled? (please list name of each medication and dose if known)  hydrALAZINE  (APRESOLINE ) 25 MG tablet pantoprazole  (PROTONIX ) 40 MG tablet  2. Which pharmacy/location (including street and city if local pharmacy) is medication to be sent to? [facility] - [place], [place] - [address]  3. Do they need a 30 day or 90 day supply?   90 day supply  Daughter says they gave the incorrect pharmacy during 8/14 appointment and would like to have prescriptions transferred to [facility].

## 2024-06-15 NOTE — Telephone Encounter (Signed)
 Patient's daughter Heron was notified that the patient's pantoprazole  refill was sent to [facility] on 06/13/24 and the hydralazine  has just been faxed to  [facility].

## 2024-12-27 ENCOUNTER — Ambulatory Visit: Admitting: Student
